# Patient Record
Sex: Female | Born: 1965 | Race: White | Hispanic: No | Marital: Married | Smoking: Never smoker
Health system: Southern US, Community
[De-identification: ages and names within clinical notes are randomized; demographics above are authoritative.]

## PROBLEM LIST (undated history)

## (undated) DIAGNOSIS — J309 Allergic rhinitis, unspecified: Secondary | ICD-10-CM

## (undated) DIAGNOSIS — J45909 Unspecified asthma, uncomplicated: Secondary | ICD-10-CM

## (undated) DIAGNOSIS — E039 Hypothyroidism, unspecified: Secondary | ICD-10-CM

## (undated) DIAGNOSIS — I10 Essential (primary) hypertension: Secondary | ICD-10-CM

## (undated) DIAGNOSIS — K219 Gastro-esophageal reflux disease without esophagitis: Secondary | ICD-10-CM

## (undated) HISTORY — DX: Allergic rhinitis, unspecified: J30.9

## (undated) HISTORY — DX: Essential (primary) hypertension: I10

## (undated) HISTORY — PX: THYROIDECTOMY: SHX17

## (undated) HISTORY — DX: Hypothyroidism, unspecified: E03.9

## (undated) HISTORY — DX: Gastro-esophageal reflux disease without esophagitis: K21.9

## (undated) HISTORY — DX: Unspecified asthma, uncomplicated: J45.909

---

## 2001-04-29 ENCOUNTER — Encounter: Payer: Self-pay | Admitting: Endocrinology

## 2001-04-29 ENCOUNTER — Ambulatory Visit (HOSPITAL_COMMUNITY): Admission: RE | Admit: 2001-04-29 | Discharge: 2001-04-29 | Payer: Self-pay | Admitting: Gastroenterology

## 2001-05-03 ENCOUNTER — Ambulatory Visit (HOSPITAL_COMMUNITY): Admission: RE | Admit: 2001-05-03 | Discharge: 2001-05-03 | Payer: Self-pay | Admitting: *Deleted

## 2001-05-10 ENCOUNTER — Encounter: Payer: Self-pay | Admitting: Endocrinology

## 2001-05-10 ENCOUNTER — Ambulatory Visit (HOSPITAL_COMMUNITY): Admission: RE | Admit: 2001-05-10 | Discharge: 2001-05-10 | Payer: Self-pay | Admitting: Endocrinology

## 2001-07-03 ENCOUNTER — Ambulatory Visit (HOSPITAL_COMMUNITY): Admission: RE | Admit: 2001-07-03 | Discharge: 2001-07-03 | Payer: Self-pay | Admitting: Endocrinology

## 2001-07-03 ENCOUNTER — Encounter: Payer: Self-pay | Admitting: Endocrinology

## 2002-01-16 ENCOUNTER — Emergency Department (HOSPITAL_COMMUNITY): Admission: EM | Admit: 2002-01-16 | Discharge: 2002-01-16 | Payer: Self-pay

## 2002-02-11 ENCOUNTER — Encounter: Payer: Self-pay | Admitting: Critical Care Medicine

## 2002-02-11 ENCOUNTER — Ambulatory Visit (HOSPITAL_COMMUNITY): Admission: RE | Admit: 2002-02-11 | Discharge: 2002-02-11 | Payer: Self-pay | Admitting: Critical Care Medicine

## 2002-02-24 ENCOUNTER — Encounter (HOSPITAL_COMMUNITY): Admission: RE | Admit: 2002-02-24 | Discharge: 2002-05-25 | Payer: Self-pay | Admitting: Endocrinology

## 2002-02-28 ENCOUNTER — Encounter: Payer: Self-pay | Admitting: Endocrinology

## 2002-03-19 ENCOUNTER — Encounter: Admission: RE | Admit: 2002-03-19 | Discharge: 2002-03-19 | Payer: Self-pay | Admitting: Otolaryngology

## 2002-03-19 ENCOUNTER — Encounter: Payer: Self-pay | Admitting: Otolaryngology

## 2002-03-21 ENCOUNTER — Ambulatory Visit (HOSPITAL_COMMUNITY): Admission: RE | Admit: 2002-03-21 | Discharge: 2002-03-21 | Payer: Self-pay | Admitting: Internal Medicine

## 2003-02-05 ENCOUNTER — Other Ambulatory Visit: Admission: RE | Admit: 2003-02-05 | Discharge: 2003-02-05 | Payer: Self-pay | Admitting: Obstetrics and Gynecology

## 2003-07-29 ENCOUNTER — Inpatient Hospital Stay (HOSPITAL_COMMUNITY): Admission: AD | Admit: 2003-07-29 | Discharge: 2003-08-03 | Payer: Self-pay | Admitting: Obstetrics and Gynecology

## 2003-07-30 ENCOUNTER — Encounter (INDEPENDENT_AMBULATORY_CARE_PROVIDER_SITE_OTHER): Payer: Self-pay | Admitting: Specialist

## 2003-09-16 ENCOUNTER — Other Ambulatory Visit: Admission: RE | Admit: 2003-09-16 | Discharge: 2003-09-16 | Payer: Self-pay | Admitting: Obstetrics and Gynecology

## 2004-01-04 ENCOUNTER — Ambulatory Visit: Payer: Self-pay | Admitting: Pulmonary Disease

## 2004-02-04 ENCOUNTER — Ambulatory Visit: Payer: Self-pay | Admitting: Critical Care Medicine

## 2004-03-04 ENCOUNTER — Ambulatory Visit: Payer: Self-pay | Admitting: Critical Care Medicine

## 2004-04-04 ENCOUNTER — Ambulatory Visit: Payer: Self-pay | Admitting: Critical Care Medicine

## 2004-04-22 ENCOUNTER — Ambulatory Visit: Payer: Self-pay | Admitting: Critical Care Medicine

## 2004-05-04 ENCOUNTER — Ambulatory Visit: Payer: Self-pay | Admitting: Critical Care Medicine

## 2004-06-06 ENCOUNTER — Ambulatory Visit: Payer: Self-pay | Admitting: Critical Care Medicine

## 2004-07-04 ENCOUNTER — Ambulatory Visit: Payer: Self-pay | Admitting: Critical Care Medicine

## 2004-08-08 ENCOUNTER — Ambulatory Visit: Payer: Self-pay | Admitting: Critical Care Medicine

## 2004-09-07 ENCOUNTER — Ambulatory Visit: Payer: Self-pay | Admitting: Critical Care Medicine

## 2004-10-10 ENCOUNTER — Ambulatory Visit: Payer: Self-pay | Admitting: Critical Care Medicine

## 2004-10-17 ENCOUNTER — Other Ambulatory Visit: Admission: RE | Admit: 2004-10-17 | Discharge: 2004-10-17 | Payer: Self-pay | Admitting: Obstetrics and Gynecology

## 2004-11-03 ENCOUNTER — Ambulatory Visit: Payer: Self-pay | Admitting: Critical Care Medicine

## 2004-12-01 ENCOUNTER — Ambulatory Visit: Payer: Self-pay | Admitting: Internal Medicine

## 2005-01-05 ENCOUNTER — Ambulatory Visit: Payer: Self-pay | Admitting: Critical Care Medicine

## 2005-02-07 ENCOUNTER — Ambulatory Visit: Payer: Self-pay | Admitting: Internal Medicine

## 2005-03-08 ENCOUNTER — Ambulatory Visit: Payer: Self-pay | Admitting: Pulmonary Disease

## 2005-04-12 ENCOUNTER — Ambulatory Visit: Payer: Self-pay | Admitting: Pulmonary Disease

## 2005-04-26 ENCOUNTER — Ambulatory Visit: Payer: Self-pay | Admitting: Critical Care Medicine

## 2005-05-15 ENCOUNTER — Ambulatory Visit: Payer: Self-pay | Admitting: Pulmonary Disease

## 2005-06-14 ENCOUNTER — Ambulatory Visit: Payer: Self-pay | Admitting: Critical Care Medicine

## 2005-07-12 ENCOUNTER — Ambulatory Visit: Payer: Self-pay | Admitting: Pulmonary Disease

## 2005-07-19 ENCOUNTER — Ambulatory Visit: Payer: Self-pay | Admitting: Internal Medicine

## 2005-08-14 ENCOUNTER — Ambulatory Visit: Payer: Self-pay | Admitting: Emergency Medicine

## 2005-09-27 ENCOUNTER — Ambulatory Visit: Payer: Self-pay | Admitting: Critical Care Medicine

## 2005-10-25 ENCOUNTER — Ambulatory Visit: Payer: Self-pay | Admitting: Critical Care Medicine

## 2005-11-22 ENCOUNTER — Ambulatory Visit: Payer: Self-pay | Admitting: Critical Care Medicine

## 2005-12-21 ENCOUNTER — Ambulatory Visit: Payer: Self-pay | Admitting: Critical Care Medicine

## 2006-03-14 ENCOUNTER — Ambulatory Visit: Payer: Self-pay | Admitting: Critical Care Medicine

## 2006-03-15 ENCOUNTER — Ambulatory Visit: Payer: Self-pay | Admitting: Critical Care Medicine

## 2006-03-30 ENCOUNTER — Ambulatory Visit: Payer: Self-pay | Admitting: Internal Medicine

## 2006-04-17 ENCOUNTER — Ambulatory Visit: Payer: Self-pay | Admitting: Critical Care Medicine

## 2006-04-26 ENCOUNTER — Ambulatory Visit: Payer: Self-pay | Admitting: Critical Care Medicine

## 2006-06-18 ENCOUNTER — Ambulatory Visit: Payer: Self-pay | Admitting: Critical Care Medicine

## 2006-07-24 ENCOUNTER — Ambulatory Visit: Payer: Self-pay | Admitting: Critical Care Medicine

## 2006-08-28 ENCOUNTER — Ambulatory Visit: Payer: Self-pay | Admitting: Critical Care Medicine

## 2006-10-04 ENCOUNTER — Ambulatory Visit: Payer: Self-pay | Admitting: Critical Care Medicine

## 2006-10-25 DIAGNOSIS — E039 Hypothyroidism, unspecified: Secondary | ICD-10-CM | POA: Insufficient documentation

## 2006-10-25 DIAGNOSIS — K219 Gastro-esophageal reflux disease without esophagitis: Secondary | ICD-10-CM | POA: Insufficient documentation

## 2006-10-25 DIAGNOSIS — J309 Allergic rhinitis, unspecified: Secondary | ICD-10-CM | POA: Insufficient documentation

## 2006-11-05 ENCOUNTER — Ambulatory Visit: Payer: Self-pay | Admitting: Critical Care Medicine

## 2006-12-05 ENCOUNTER — Ambulatory Visit: Payer: Self-pay | Admitting: Critical Care Medicine

## 2007-01-14 ENCOUNTER — Ambulatory Visit: Payer: Self-pay | Admitting: Critical Care Medicine

## 2007-01-21 ENCOUNTER — Ambulatory Visit: Payer: Self-pay | Admitting: Critical Care Medicine

## 2007-02-19 ENCOUNTER — Ambulatory Visit: Payer: Self-pay | Admitting: Critical Care Medicine

## 2007-04-05 ENCOUNTER — Ambulatory Visit: Payer: Self-pay | Admitting: Critical Care Medicine

## 2007-05-07 ENCOUNTER — Ambulatory Visit: Payer: Self-pay | Admitting: Critical Care Medicine

## 2007-05-09 DIAGNOSIS — J4550 Severe persistent asthma, uncomplicated: Secondary | ICD-10-CM | POA: Insufficient documentation

## 2007-06-07 ENCOUNTER — Ambulatory Visit: Payer: Self-pay | Admitting: Critical Care Medicine

## 2007-07-10 ENCOUNTER — Ambulatory Visit: Payer: Self-pay | Admitting: Critical Care Medicine

## 2007-09-03 ENCOUNTER — Ambulatory Visit: Payer: Self-pay | Admitting: Critical Care Medicine

## 2007-10-07 ENCOUNTER — Ambulatory Visit: Payer: Self-pay | Admitting: Critical Care Medicine

## 2007-10-17 ENCOUNTER — Ambulatory Visit: Payer: Self-pay | Admitting: Critical Care Medicine

## 2007-10-30 ENCOUNTER — Ambulatory Visit: Payer: Self-pay | Admitting: Critical Care Medicine

## 2007-10-31 ENCOUNTER — Encounter: Payer: Self-pay | Admitting: Critical Care Medicine

## 2007-11-21 ENCOUNTER — Encounter: Payer: Self-pay | Admitting: Internal Medicine

## 2007-11-22 ENCOUNTER — Ambulatory Visit: Payer: Self-pay | Admitting: Critical Care Medicine

## 2008-01-30 ENCOUNTER — Ambulatory Visit: Payer: Self-pay | Admitting: Critical Care Medicine

## 2008-03-13 ENCOUNTER — Ambulatory Visit: Payer: Self-pay | Admitting: Critical Care Medicine

## 2008-04-09 ENCOUNTER — Ambulatory Visit: Payer: Self-pay | Admitting: Critical Care Medicine

## 2008-06-02 ENCOUNTER — Ambulatory Visit: Payer: Self-pay | Admitting: Critical Care Medicine

## 2008-06-04 ENCOUNTER — Ambulatory Visit: Payer: Self-pay | Admitting: Pulmonary Disease

## 2008-06-04 ENCOUNTER — Telehealth (INDEPENDENT_AMBULATORY_CARE_PROVIDER_SITE_OTHER): Payer: Self-pay | Admitting: *Deleted

## 2008-07-02 ENCOUNTER — Telehealth (INDEPENDENT_AMBULATORY_CARE_PROVIDER_SITE_OTHER): Payer: Self-pay | Admitting: *Deleted

## 2008-07-23 ENCOUNTER — Ambulatory Visit: Payer: Self-pay | Admitting: Critical Care Medicine

## 2008-08-27 ENCOUNTER — Ambulatory Visit: Payer: Self-pay | Admitting: Critical Care Medicine

## 2008-10-05 ENCOUNTER — Telehealth: Payer: Self-pay | Admitting: Critical Care Medicine

## 2008-10-08 ENCOUNTER — Ambulatory Visit: Payer: Self-pay | Admitting: Critical Care Medicine

## 2008-11-26 ENCOUNTER — Ambulatory Visit: Payer: Self-pay | Admitting: Critical Care Medicine

## 2009-01-28 ENCOUNTER — Ambulatory Visit: Payer: Self-pay | Admitting: Critical Care Medicine

## 2009-02-26 ENCOUNTER — Ambulatory Visit: Payer: Self-pay | Admitting: Critical Care Medicine

## 2009-03-30 ENCOUNTER — Ambulatory Visit: Payer: Self-pay | Admitting: Critical Care Medicine

## 2009-04-15 ENCOUNTER — Ambulatory Visit: Payer: Self-pay | Admitting: Critical Care Medicine

## 2009-05-11 ENCOUNTER — Ambulatory Visit: Payer: Self-pay | Admitting: Critical Care Medicine

## 2009-05-18 ENCOUNTER — Ambulatory Visit: Payer: Self-pay | Admitting: Family Medicine

## 2009-05-18 DIAGNOSIS — S93609A Unspecified sprain of unspecified foot, initial encounter: Secondary | ICD-10-CM | POA: Insufficient documentation

## 2009-06-18 ENCOUNTER — Ambulatory Visit: Payer: Self-pay | Admitting: Critical Care Medicine

## 2009-07-27 ENCOUNTER — Ambulatory Visit: Payer: Self-pay | Admitting: Critical Care Medicine

## 2009-08-05 ENCOUNTER — Ambulatory Visit: Payer: Self-pay | Admitting: Critical Care Medicine

## 2009-10-07 ENCOUNTER — Ambulatory Visit: Payer: Self-pay | Admitting: Critical Care Medicine

## 2009-11-18 ENCOUNTER — Ambulatory Visit: Payer: Self-pay | Admitting: Critical Care Medicine

## 2010-01-07 ENCOUNTER — Telehealth: Payer: Self-pay | Admitting: Critical Care Medicine

## 2010-01-10 ENCOUNTER — Ambulatory Visit: Payer: Self-pay | Admitting: Critical Care Medicine

## 2010-01-27 ENCOUNTER — Ambulatory Visit: Payer: Self-pay | Admitting: Critical Care Medicine

## 2010-02-09 ENCOUNTER — Ambulatory Visit: Payer: Self-pay | Admitting: Critical Care Medicine

## 2010-03-11 ENCOUNTER — Ambulatory Visit: Admit: 2010-03-11 | Payer: Self-pay | Admitting: Critical Care Medicine

## 2010-03-22 NOTE — Assessment & Plan Note (Signed)
Summary: xolair/apc  Nurse Visit   Allergies: No Known Drug Allergies  Medication Administration  Injection # 1:    Medication: Xolair (omalizumab) 150mg     Diagnosis: EXTRINSIC ASTHMA, UNSPECIFIED (ICD-493.00)    Route: SQ    Site: L deltoid    Exp Date: 04/2012    Lot #: [id number]    Mfr: Mendel Ryder    Comments: Injection given by Dimas Millin in allergy lab. Xolair 300mg . 1.43ml x 1 in Left and Right Deltoid. Pt did not wait.   Orders Added: 1)  Admin of patients own med IM/SQ [96372M]

## 2010-03-22 NOTE — Assessment & Plan Note (Signed)
Summary: xolair/ mbw  Nurse Visit   Allergies: No Known Drug Allergies  Medication Administration  Injection # 1:    Medication: Xolair (omalizumab) 150mg     Diagnosis: EXTRINSIC ASTHMA, UNSPECIFIED (ICD-493.00)    Route: SQ    Site: R deltoid    Exp Date: 06/20/2012    Lot #: [id number]    Mfr: GENENTECH    Comments: 1.2 ML IN RIGHT ARM AND 1.2 ML IN LEFT PT WAITED 20 MINS    Patient tolerated injection without complications    Given by: SUSANNE FORD IN ALLERGY LAB  Orders Added: 1)  Xolair (omalizumab) 150mg  [J2357] 2)  Administration xolair injection [id number]   Medication Administration  Injection # 1:    Medication: Xolair (omalizumab) 150mg     Diagnosis: EXTRINSIC ASTHMA, UNSPECIFIED (ICD-493.00)    Route: SQ    Site: R deltoid    Exp Date: 06/20/2012    Lot #: [id number]    Mfr: GENENTECH    Comments: 1.2 ML IN RIGHT ARM AND 1.2 ML IN LEFT PT WAITED 20 MINS    Patient tolerated injection without complications    Given by: SUSANNE FORD IN ALLERGY LAB  Orders Added: 1)  Xolair (omalizumab) 150mg  [J2357] 2)  Administration xolair injection [[id number]]

## 2010-03-22 NOTE — Assessment & Plan Note (Signed)
Summary: xolair/jd  Nurse Visit   Allergies: No Known Drug Allergies  Medication Administration  Injection # 1:    Medication: Xolair (omalizumab) 150mg     Diagnosis: EXTRINSIC ASTHMA, UNSPECIFIED (ICD-493.00)    Route: SQ    Site: L deltoid    Exp Date: 11/2012    Lot #: [id number]    Mfr: Genetech    Comments: 1.2 ML IN LEFT AND RIGHT ARM 300MG  PT DIDNT WAIT CHARGED [id number] AND [id number]    Given by: Dimas Millin IN ALLERGY LAB  Orders Added: 1)  Xolair (omalizumab) 150mg  [J2357] 2)  Administration xolair injection [[id number]]   Medication Administration  Injection # 1:    Medication: Xolair (omalizumab) 150mg     Diagnosis: EXTRINSIC ASTHMA, UNSPECIFIED (ICD-493.00)    Route: SQ    Site: L deltoid    Exp Date: 11/2012    Lot #: [id number]    Mfr: Genetech    Comments: 1.2 ML IN LEFT AND RIGHT ARM 300MG  PT DIDNT WAIT CHARGED [id number] AND [id number]    Given by: Dimas Millin IN ALLERGY LAB  Orders Added: 1)  Xolair (omalizumab) 150mg  [J2357] 2)  Administration xolair injection [[id number]]

## 2010-03-22 NOTE — Miscellaneous (Signed)
Summary: Injection Record / [facility]    Injection Record / [facility]    Imported By: Lennie Odor 07/12/2009 15:34:17  _____________________________________________________________________  External Attachment:    Type:   Image     Comment:   External Document

## 2010-03-22 NOTE — Assessment & Plan Note (Signed)
Summary: Krystal Scott  Nurse Visit   Allergies: No Known Drug Allergies  Medication Administration  Injection # 1:    Medication: Xolair (omalizumab) 150mg     Diagnosis: EXTRINSIC ASTHMA, UNSPECIFIED (ICD-493.00)    Route: SQ    Site: R deltoid    Exp Date: 05/2012    Lot #: [id number]    Mfr: Mendel Ryder    Comments: Injection given by Drucie Opitz, CMA in allergy lab. Xolair 300mg . 1.23ml x 1 in Right and Left Deltoid. Pt waited 10 minutes.     Patient tolerated injection without complications  Orders Added: 1)  Admin of patients own med IM/SQ [[id number]]

## 2010-03-22 NOTE — Assessment & Plan Note (Signed)
Summary: Krystal Scott  Nurse Visit   Allergies: No Known Drug Allergies  Medication Administration  Injection # 1:    Medication: Xolair (omalizumab) 150mg     Diagnosis: EXTRINSIC ASTHMA, UNSPECIFIED (ICD-493.00)    Route: SQ    Site: R deltoid    Exp Date: 02/2012    Lot #: [id number]    Mfr: Mendel Ryder    Comments: Injection given by Dimas Millin in allergy lab. Xolair 300mg . 1.48ml x 1 in Right and Left Deltoid. Pt did not wait.   Orders Added: 1)  Admin of Therapeutic Inj  intramuscular or subcutaneous [[id number]] 2)  Xolair (omalizumab) 150mg  [J2357]

## 2010-03-22 NOTE — Assessment & Plan Note (Signed)
Summary: Pulmonary OV   Primary Provider/Referring Provider:  Dr. Sabino Niemann [place] in [place]  CC:  6 wk follow up.  Pt states breathing is better since last OV.  C/o getting yellow and bloody mucus out of sinuses x 2 day.  Denies cough, SOB, and chest tightness.  Requesting flu vac today.Marland Kitchen  History of Present Illness:   Krystal Scott is a 45 year old African American female, with moderate persistent asthma   November 18, 2009 11:03 AM Two days ago developed yellow and bloody mucus.  Notes some wheezing. likes dulera better Pt denies any significant sore throat, nasal congestion or excess secretions, fever, chills, sweats, unintended weight loss, pleurtic or exertional chest pain, orthopnea PND, or leg swelling Pt denies any increase in rescue therapy over baseline, denies waking up needing it or having any early am or nocturnal exacerbations of coughing/wheezing/or dyspnea. Pt also denies any obvious fluctuation in symptoms wiht weather or environmental change or other alleviating or aggravating factors     Asthma History    Asthma Control Assessment:    Age range: 12+ years    Symptoms: 0-2 days/week    Nighttime Awakenings: 0-2/month    Interferes w/ normal activity: some limitations    SABA use (not for EIB): 0-2 days/week    ATAQ questionnaire: 0    FEV1: 1.44 liters (today)    FEV1 Pred: 2.69 liters (today)    Exacerbations requiring oral systemic steroids: 0-1/year    Asthma Control Assessment: Very Poorly Controlled   Preventive Screening-Counseling & Management  Alcohol-Tobacco     Smoking Status: never  Current Medications (verified): 1)  Synthroid 175 Mcg  Tabs (Levothyroxine Sodium) .Marland Kitchen.. 1 By Mouth Daily Except Monday 2)  Nexium 40 Mg  Cpdr (Esomeprazole Magnesium) .... One By Mouth Once Daily As Needed 3)  Xolair 150 Mg  Solr (Omalizumab) .... 300mg  Subcutaneously Monthly 4)  Proair Hfa 108 (90 Base) Mcg/act Aers (Albuterol Sulfate) .... One To Two Puffs Every 4  Hours As Needed 5)  Albuterol Sulfate (2.5 Mg/35ml) 0.083% Nebu (Albuterol Sulfate) .... As Needed 6)  Qvar 80 Mcg/act Aers (Beclomethasone Dipropionate) .... 4   Puffs Two Times A Day 7)  Dulera 200-5 Mcg/act Aero (Mometasone Furo-Formoterol Fum) .... 2 Puffs Twice Per Day 8)  Flunisolide 0.025 % Soln (Flunisolide) .... Two Sprays Each Nostril Daily  Allergies (verified): No Known Drug Allergies  Past History:  Past medical, surgical, family and social histories (including risk factors) reviewed, and no changes noted (except as noted below).  Past Medical History: Reviewed history from 10/17/2007 and no changes required. Allergic rhinitis Asthma   -FeV1 54%  Fef 25-75 27%  08/2006 GERD Hypothyroidism  Past Surgical History: Reviewed history from 05/18/2009 and no changes required. Thyroidectomy- 1988  Family History: Reviewed history from 05/18/2009 and no changes required. father- DM mother- htn  Social History: Reviewed history from 05/18/2009 and no changes required. Never Smoked Alcohol use-no Drug use-no Regular exercise-yes  Review of Systems       The patient complains of shortness of breath with activity, productive cough, nasal congestion/difficulty breathing through nose, and change in color of mucus.  The patient denies shortness of breath at rest, non-productive cough, coughing up blood, chest pain, irregular heartbeats, acid heartburn, indigestion, loss of appetite, weight change, abdominal pain, difficulty swallowing, sore throat, tooth/dental problems, headaches, sneezing, itching, ear ache, anxiety, depression, hand/feet swelling, joint stiffness or pain, rash, and fever.    Vital Signs:  Patient profile:   45 year old  female Height:      64 inches Weight:      169 pounds BMI:     29.11 O2 Sat:      99 % on Room air Temp:     98.5 degrees F oral Pulse rate:   77 / minute BP sitting:   110 / 78  (left arm) Cuff size:   regular  Vitals Entered By:  Gweneth Dimitri RN (November 18, 2009 10:56 AM)  O2 Flow:  Room air CC: 6 wk follow up.  Pt states breathing is better since last OV.  C/o getting yellow and bloody mucus out of sinuses x 2 day.  Denies cough, SOB, chest tightness.  Requesting flu vac today. Comments Medications reviewed with patient Daytime contact number verified with patient. Gweneth Dimitri RN  November 18, 2009 10:58 AM    Physical Exam  Additional Exam:  Gen. Pleasant, well-nourished, in no distress ENT - thick white secretions   persisting turbinate edema,  post nasal drip + Neck: No JVD, no thyromegaly, no carotid bruits Lungs: no use of accessory muscles, no dullness to percussion, clear  Cardiovascular: Rhythm regular, heart sounds  normal, no murmurs or gallops, no peripheral edema Musculoskeletal: No deformities, no cyanosis or clubbing      Pre-Spirometry FEV1    Value: 1.44 L     Pred: 2.69 L     Impression & Recommendations:  Problem # 1:  OTHER ACUTE SINUSITIS (ICD-461.8) Assessment Deteriorated Acute /recurrent sinusitis with flare plan augmentin x 10days  nasal hygiene double flonase flu and pneumonia vaccines Her updated medication list for this problem includes:    Flunisolide 0.025 % Soln (Flunisolide) .Marland Kitchen..Marland Kitchen Two sprays each nostril daily    Augmentin 875-125 Mg Tabs (Amoxicillin-pot clavulanate) ..... By mouth twice daily please ignore prior rx and honor this rx  Orders: Est. Patient Level IV (72536)  Problem # 2:  EXTRINSIC ASTHMA, UNSPECIFIED (ICD-493.00) Assessment: Unchanged see assessment number one continue inhaled medications  Medications Added to Medication List This Visit: 1)  Augmentin 875-125 Mg Tabs (Amoxicillin-pot clavulanate) .... By mouth twice daily 2)  Augmentin 875-125 Mg Tabs (Amoxicillin-pot clavulanate) .... By mouth twice daily please ignore prior rx and honor this rx  Complete Medication List: 1)  Synthroid 175 Mcg Tabs (Levothyroxine sodium) .Marland Kitchen.. 1 by mouth  daily except monday 2)  Nexium 40 Mg Cpdr (Esomeprazole magnesium) .... One by mouth once daily as needed 3)  Xolair 150 Mg Solr (Omalizumab) .... 300mg  subcutaneously monthly 4)  Proair Hfa 108 (90 Base) Mcg/act Aers (Albuterol sulfate) .... One to two puffs every 4 hours as needed 5)  Albuterol Sulfate (2.5 Mg/54ml) 0.083% Nebu (Albuterol sulfate) .... As needed 6)  Qvar 80 Mcg/act Aers (Beclomethasone dipropionate) .... 4   puffs two times a day 7)  Dulera 200-5 Mcg/act Aero (Mometasone furo-formoterol fum) .... 2 puffs twice per day 8)  Flunisolide 0.025 % Soln (Flunisolide) .... Two sprays each nostril daily 9)  Augmentin 875-125 Mg Tabs (Amoxicillin-pot clavulanate) .... By mouth twice daily please ignore prior rx and honor this rx  Other Orders: Flu Vaccine 56yrs + [phone]) Admin 1st Vaccine (47425) Pneumococcal Vaccine (95638) Admin of Any Addtl Vaccine (75643)  Patient Instructions: 1)  Augmentin one twice daily for 10days 2)  Increase flonase to two sprays each nostril twice daily for 7 days then reduce to once daily 3)  No change in Dulera 4)  Use nasal wash such as [facility] Med and rinse twice daily for [employer]  5)  Flu vaccine and pneumovax today 6)  Return 2 months  Prescriptions: AUGMENTIN 875-125 MG  TABS (AMOXICILLIN-POT CLAVULANATE) By mouth twice daily Please ignore prior Rx and honor this Rx  #20 x 0   Entered and Authorized by:   Storm Frisk MD   Signed by:   Storm Frisk MD on 11/18/2009   Method used:   [facility]        [facility] [phone]* (retail)       [address] [place], [place]  [address]       Ph: [phone] or [phone]       Fax: [phone]   RxID:   [id number] AUGMENTIN 875-125 MG  TABS (AMOXICILLIN-POT CLAVULANATE) By mouth twice daily  #14 x 0   Entered and Authorized by:   Storm Frisk MD   Signed by:   Storm Frisk MD on 11/18/2009   Method used:   [facility] to        [facility] [phone]*  (retail)       [address] [place], [place]  [address]       Ph: [phone] or [phone]       Fax: [phone]   RxID:   [id number]    Immunizations Administered:  Influenza Vaccine # 1:    Vaccine Type: Fluvax 3+    Site: left deltoid    Mfr: GlaxoSmithKline    Dose: 0.5 ml    Route: IM    Given by: Kandice Hams CMA    Exp. Date: 08/20/2010    Lot #: [id number]    VIS given: 09/14/09 version given November 18, 2009.  Pneumonia Vaccine:    Vaccine Type: Pneumovax    Site: right deltoid    Mfr: Merck    Dose: 0.5 ml    Route: IM    Given by: Kandice Hams CMA    Exp. Date: 03/09/2011    Lot #: [id number]    VIS given: 01/25/09 version given November 18, 2009.  Flu Vaccine Consent Questions:    Do you have a history of severe allergic reactions to this vaccine? no    Any prior history of allergic reactions to egg and/or gelatin? no    Do you have a sensitivity to the preservative Thimersol? no    Do you have a past history of Guillan-Barre Syndrome? no    Do you currently have an acute febrile illness? no    Have you ever had a severe reaction to latex? no    Vaccine information given and explained to patient? yes    Are you currently pregnant? no   Augmentin 7 day rx sent in error.  [facility] with [place].  Informed her to pls disregard the 7 day augmentin rx and honor the 10 day rx.  She verbalized understanding of this.  Gweneth Dimitri RN  November 18, 2009 11:16 AM   Prevention & Chronic Care Immunizations   Influenza vaccine: Fluvax 3+  (11/18/2009)    Tetanus booster: Not documented    Pneumococcal vaccine: Pneumovax  (11/18/2009)  Other Screening   Pap smear: Not documented    Mammogram: Not documented   Smoking status: never  (11/18/2009)  Lipids   Total Cholesterol: Not documented   LDL: Not documented   LDL Direct: Not documented   HDL: Not documented   Triglycerides: Not documented  Nursing Instructions: Give Pneumovax  today Give Flu vaccine today     Appended Document: Pulmonary OV fax shirapa sinthusec  [place]

## 2010-03-22 NOTE — Assessment & Plan Note (Signed)
Summary: Pulmonary OV   Primary Provider/Referring Provider:  Dr. Sabino Niemann [place] in [place]  CC:  4 month follow up.  States she was off of meds for a short period of time and while off of them she would occasionally have SOB and wheezing while working out.  States since she has restarted meds and taking them on a regular basis no longer having SOB or wheezing while working out.  states she sees and feels an improvement in breathing.  denies chest tightness and cough.  Requesting rxs for [facility]erol neb and [facility]ort[facility] [place]. Marland Kitchen  History of Present Illness:   Ms. Manka is a 45 year old African American female, with moderate persistent asthma    April 15, 2009 10:30 AM lots of no shows working out more since 8/10.  Taekwondo one month ago with insurance and had to put hold on meds. and was real show off meds due to lack of insurance now back on meds,  and is better now recently 260 best ever pfr:  300   Asthma History    Asthma Control Assessment:    Age range: 12+ years    Symptoms: 0-2 days/week    Nighttime Awakenings: 0-2/month    Interferes w/ normal activity: no limitations    SABA use (not for EIB): 0-2 days/week    ATAQ questionnaire: 0    FEV1: 1.44 liters (today)    FEV1 Pred: 2.69 liters (today)    Exacerbations requiring oral systemic steroids: 0-1/year    Asthma Control Assessment: Very Poorly Controlled    Preventive Screening-Counseling & Management  Alcohol-Tobacco     Smoking Status: never  Current Medications (verified): 1)  Synthroid 175 Mcg  Tabs (Levothyroxine Sodium) .Marland Kitchen.. 1 By Mouth Daily Except Monday 2)  Nexium 40 Mg  Cpdr (Esomeprazole Magnesium) .... One By Mouth Once Daily 3)  Xolair 150 Mg  Solr (Omalizumab) .... 300mg  Subcutaneously Monthly 4)  Proair Hfa 108 (90 Base) Mcg/act Aers (Albuterol Sulfate) .... One To Two Puffs Every 4 Hours As Needed 5)  Albuterol Sulfate (2.5 Mg/80ml) 0.083% Nebu (Albuterol Sulfate) .... As Needed 6)   Qvar 80 Mcg/act Aers (Beclomethasone Dipropionate) .... 2 Puffs Two Times A Day 7)  Symbicort 160-4.5 Mcg/act  Aero (Budesonide-Formoterol Fumarate) .... Two Puffs Twice Daily 8)  Nasacort Aq 55 Mcg/act  Aers (Triamcinolone Acetonide(Nasal)) .... Two Puffs Each Nostril Daily  Allergies (verified): No Known Drug Allergies  Past History:  Past medical, surgical, family and social histories (including risk factors) reviewed, and no changes noted (except as noted below).  Past Medical History: Reviewed history from 10/17/2007 and no changes required. Allergic rhinitis Asthma   -FeV1 54%  Fef 25-75 27%  08/2006 GERD Hypothyroidism  Family History: Reviewed history and no changes required.  Social History: Reviewed history and no changes required.  Review of Systems       The patient complains of shortness of breath with activity and non-productive cough.  The patient denies shortness of breath at rest, productive cough, coughing up blood, chest pain, irregular heartbeats, acid heartburn, indigestion, loss of appetite, weight change, abdominal pain, difficulty swallowing, sore throat, tooth/dental problems, headaches, nasal congestion/difficulty breathing through nose, sneezing, itching, ear ache, anxiety, depression, hand/feet swelling, joint stiffness or pain, rash, change in color of mucus, and fever.    Vital Signs:  Patient profile:   45 year old female Height:      64 inches Weight:      173 pounds BMI:     29.80 O2  Sat:      98 % on Room air Temp:     97.9 degrees F oral Pulse rate:   93 / minute BP sitting:   120 / 84  (left arm) Cuff size:   regular  Vitals Entered By: Gweneth Dimitri RN (April 15, 2009 10:08 AM)  O2 Flow:  Room air CC: 4 month follow up.  States she was off of meds for a short period of time and while off of them she would occasionally have SOB and wheezing while working out.  States since she has restarted meds and taking them on a regular basis no  longer having SOB or wheezing while working out.  states she sees and feels an improvement in breathing.  denies chest tightness and cough.  Requesting rxs [facility]ol neb [facility] [place].  Comments Medications reviewed with patient Daytime contact number verified with patient. Gweneth Dimitri RN  April 15, 2009 10:08 AM    Physical Exam  Additional Exam:  Gen. Pleasant, well-nourished, in no distress ENT - no lesions,  post nasal drip + Neck: No JVD, no thyromegaly, no carotid bruits Lungs: no use of accessory muscles, no dullness to percussion, insp/exp wheezes, poor airflow Cardiovascular: Rhythm regular, heart sounds  normal, no murmurs or gallops, no peripheral edema Musculoskeletal: No deformities, no cyanosis or clubbing      Pre-Spirometry FEV1    Value: 1.44 L     Pred: 2.69 L     Impression & Recommendations:  Problem # 1:  EXTRINSIC ASTHMA, UNSPECIFIED (ICD-493.00) Assessment Deteriorated  Moderate persistent asthma with exacerbation due to medication noncompliance plan Prednisone 10mg  Take 4 daily for two days, then 3 daily for two days, then two daily for two days then one daily for two days then stop Increase qvar three puffs twice daily for 7days then two puff twice daily No other medication changes Peak flow rate check about once daily, see asthma action plan Return 2 months [place]  Medications Added to Medication List This Visit: 1)  Qvar 80 Mcg/act Aers (Beclomethasone dipropionate) .... 3  puffs two times a day for one week then two puff twice daily 2)  Prednisone 10 Mg Tabs (Prednisone) .... Take as directed take 4 daily for two days, then 3 daily for two days, then two daily for two days then one daily for two days then stop  Complete Medication List: 1)  Synthroid 175 Mcg Tabs (Levothyroxine sodium) .Marland Kitchen.. 1 by mouth daily except monday 2)  Nexium 40 Mg Cpdr (Esomeprazole magnesium) .... One by mouth once daily 3)  Xolair 150 Mg Solr  (Omalizumab) .... 300mg  subcutaneously monthly 4)  Proair Hfa 108 (90 Base) Mcg/act Aers (Albuterol sulfate) .... One to two puffs every 4 hours as needed 5)  Albuterol Sulfate (2.5 Mg/67ml) 0.083% Nebu (Albuterol sulfate) .... As needed 6)  Qvar 80 Mcg/act Aers (Beclomethasone dipropionate) .... 3  puffs two times a day for one week then two puff twice daily 7)  Symbicort 160-4.5 Mcg/act Aero (Budesonide-formoterol fumarate) .... Two puffs twice daily 8)  Nasacort Aq 55 Mcg/act Aers (Triamcinolone acetonide(nasal)) .... Two puffs each nostril daily 9)  Prednisone 10 Mg Tabs (Prednisone) .... Take as directed take 4 daily for two days, then 3 daily for two days, then two daily for two days then one daily for two days then stop  Other Orders: Est. Patient Level V (04540)  Asthma Management Plan    Asthma Severity: Severe Persistent  Control Assessment: Very Poorly Controlled    Personal best PEF: 300 liters/minute    Predicted PEF: 473 liters/minute    Working PEF: 473 liters/minute    Plan based on PEF formula: [place] [employer] Zone: (Range: 350 to 300) QVAR 80 MCG/ACT AERS two puff twice daily SYMBICORT 160-4.5 MCG/ACT  AERO:  2 puffs every 12 hours  Yellow Zone: ALBUTEROL SULFATE (2.5 MG/3ML) 0.083% NEBU:  nebulizer treatment twice a day PROAIR HFA 108 (90 BASE) MCG/ACT AERS,ALBUTEROL SULFATE (2.5 MG/3ML) 0.083% NEBU:  2 puffs every 3-4 hours (yellow zone) QVAR 80 MCG/ACT AERS:  2 puffs every 12 hours SYMBICORT 160-4.5 MCG/ACT  AERO:  2 puffs every 12 hours  Red Zone: Call your physician for shortness of breath.  Start Prednisone Taper.   ALBUTEROL SULFATE (2.5 MG/3ML) 0.083% NEBU:  nebulizer treatment twice a day  Patient Instructions: 1)  Prednisone 10mg  Take 4 daily for two days, then 3 daily for two days, then two daily for two days then one daily for two days then stop 2)  Increase qvar three puffs twice daily for 7days then two puff twice daily 3)  No other  medication changes 4)  Peak flow rate check about once daily, see asthma action plan 5)  Return 2 months [place] Prescriptions: PREDNISONE 10 MG  TABS (PREDNISONE) Take as directed Take 4 daily for two days, then 3 daily for two days, then two daily for two days then one daily for two days then stop  #20 x 0   Entered and Authorized by:   Storm Frisk MD   Signed by:   Storm Frisk MD on 04/15/2009   Method used:   [facility]        [facility] [phone]* (retail)       [address] [place], [place]  [address]       Ph: [phone] or [phone]       Fax: [phone]   RxID:   [id number] ALBUTEROL SULFATE (2.5 MG/3ML) 0.083% NEBU (ALBUTEROL SULFATE) as needed  #80 x 6   Entered and Authorized by:   Storm Frisk MD   Signed by:   Storm Frisk MD on 04/15/2009   Method used:   [facility]ically to        [facility] [phone]* (retail)       [address] [place], [place]  [address]       Ph: [phone] or [phone]       Fax: [phone]   RxID:   [id number]     Appended Document: Pulmonary OV fax   Dr. Sabino Niemann [place] in [place]

## 2010-03-22 NOTE — Assessment & Plan Note (Signed)
Summary: Pulmonary OV   Primary Provider/Referring Provider:  Dr. Sabino Niemann [place] in [place]  CC:  2 month follow up.  Pt states overall breathing is doing well but does c/o wheezing yesterday and slight increased SOB with activity yesterday and today.  Denies chest tightness and cough..  History of Present Illness:   Krystal Scott is a 45 year old African American female, with moderate persistent asthma   January 27, 2010 11:15 AM No issus so far this fall.  Dyspnea last two days sl more wheezing. Dust issues.  today with activity was more dyspneic.  No problems since last ov 9/11. Pt denies any significant sore throat, nasal congestion or excess secretions, fever, chills, sweats, unintended weight loss, pleurtic or exertional chest pain, orthopnea PND, or leg swelling Pt denies any increase in rescue therapy over baseline, denies waking up needing it or having any early am or nocturnal exacerbations of coughing/wheezing/or dyspnea.   Asthma History    Asthma Control Assessment:    Age range: 12+ years    Symptoms: 0-2 days/week    Nighttime Awakenings: 0-2/month    Interferes w/ normal activity: some limitations    SABA use (not for EIB): 0-2 days/week    ATAQ questionnaire: 0    Exacerbations requiring oral systemic steroids: 0-1/year    Asthma Control Assessment: Not Well Controlled   Current Medications (verified): 1)  Synthroid 175 Mcg  Tabs (Levothyroxine Sodium) .Marland Kitchen.. 1 By Mouth Daily Except Monday 2)  Nexium 40 Mg  Cpdr (Esomeprazole Magnesium) .... One By Mouth Once Daily 3)  Xolair 150 Mg  Solr (Omalizumab) .... 300mg  Subcutaneously Monthly 4)  Proair Hfa 108 (90 Base) Mcg/act Aers (Albuterol Sulfate) .... One To Two Puffs Every 4 Hours As Needed 5)  Albuterol Sulfate (2.5 Mg/34ml) 0.083% Nebu (Albuterol Sulfate) .... As Needed 6)  Qvar 80 Mcg/act Aers (Beclomethasone Dipropionate) .... 4   Puffs Two Times A Day 7)  Dulera 200-5 Mcg/act Aero (Mometasone Furo-Formoterol Fum)  .... 2 Puffs Twice Per Day 8)  Cyclobenzaprine Hcl 10 Mg Tabs (Cyclobenzaprine Hcl) .... As Needed  Allergies (verified): No Known Drug Allergies  Past History:  Past medical, surgical, family and social histories (including risk factors) reviewed, and no changes noted (except as noted below).  Past Medical History: Reviewed history from 10/17/2007 and no changes required. Allergic rhinitis Asthma   -FeV1 54%  Fef 25-75 27%  08/2006 GERD Hypothyroidism  Past Surgical History: Reviewed history from 05/18/2009 and no changes required. Thyroidectomy- 1988  Family History: Reviewed history from 05/18/2009 and no changes required. father- DM mother- htn  Social History: Reviewed history from 05/18/2009 and no changes required. Never Smoked Alcohol use-no Drug use-no Regular exercise-yes  Review of Systems       The patient complains of shortness of breath with activity.  The patient denies shortness of breath at rest, productive cough, non-productive cough, coughing up blood, chest pain, irregular heartbeats, acid heartburn, indigestion, loss of appetite, weight change, abdominal pain, difficulty swallowing, sore throat, tooth/dental problems, headaches, nasal congestion/difficulty breathing through nose, sneezing, itching, ear ache, anxiety, depression, hand/feet swelling, joint stiffness or pain, rash, change in color of mucus, and fever.    Vital Signs:  Patient profile:   45 year old female Height:      64 inches Weight:      174.31 pounds BMI:     30.03 O2 Sat:      99 % on Room air Temp:     97.8 degrees F oral  Pulse rate:   98 / minute BP sitting:   130 / 70  (right arm) Cuff size:   large  Vitals Entered By: Krystal Dimitri RN (January 27, 2010 11:07 AM)  O2 Flow:  Room air CC: 2 month follow up.  Pt states overall breathing is doing well but does c/o wheezing yesterday and slight increased SOB with activity yesterday and today.  Denies chest tightness and  cough. Comments Medications reviewed with patient Daytime contact number verified with patient. Krystal Dimitri RN  January 27, 2010 11:08 AM    Physical Exam  Additional Exam:  Gen. Pleasant, well-nourished, in no distress ENT -improved edema, no purulence Neck: No JVD, no thyromegaly, no carotid bruits Lungs: no use of accessory muscles, no dullness to percussion, clear  Cardiovascular: Rhythm regular, heart sounds  normal, no murmurs or gallops, no peripheral edema Musculoskeletal: No deformities, no cyanosis or clubbing      Impression & Recommendations:  Problem # 1:  EXTRINSIC ASTHMA, UNSPECIFIED (ICD-493.00) Assessment Improved Moderate persistent asthma with atopy now stable plan No change in inhaled medications.  except reduce qvar to 3puff two times a day  Maintain treatment program as currently prescribed.  Medications Added to Medication List This Visit: 1)  Nexium 40 Mg Cpdr (Esomeprazole magnesium) .... One by mouth once daily 2)  Qvar 80 Mcg/act Aers (Beclomethasone dipropionate) .... 3   puffs two times a day 3)  Cyclobenzaprine Hcl 10 Mg Tabs (Cyclobenzaprine hcl) .... As needed 4)  Fluticasone Propionate 50 Mcg/act Susp (Fluticasone propionate) .... One - two sprays each nostril daily as needed  Complete Medication List: 1)  Synthroid 175 Mcg Tabs (Levothyroxine sodium) .Marland Kitchen.. 1 by mouth daily except monday 2)  Nexium 40 Mg Cpdr (Esomeprazole magnesium) .... One by mouth once daily 3)  Xolair 150 Mg Solr (Omalizumab) .... 300mg  subcutaneously monthly 4)  Proair Hfa 108 (90 Base) Mcg/act Aers (Albuterol sulfate) .... One to two puffs every 4 hours as needed 5)  Albuterol Sulfate (2.5 Mg/30ml) 0.083% Nebu (Albuterol sulfate) .... As needed 6)  Qvar 80 Mcg/act Aers (Beclomethasone dipropionate) .... 3   puffs two times a day 7)  Dulera 200-5 Mcg/act Aero (Mometasone furo-formoterol fum) .... 2 puffs twice per day 8)  Cyclobenzaprine Hcl 10 Mg Tabs (Cyclobenzaprine  hcl) .... As needed 9)  Fluticasone Propionate 50 Mcg/act Susp (Fluticasone propionate) .... One - two sprays each nostril daily as needed  Other Orders: Est. Patient Level III (16109)  Patient Instructions: 1)  Reduce Qvar to three puff twice daily 2)  No other medication changes 3)  Return 4 months 4)  Follow reflux diet Prescriptions: QVAR 80 MCG/ACT AERS (BECLOMETHASONE DIPROPIONATE) 3   puffs two times a day  #1 x 6   Entered and Authorized by:   Storm Frisk MD   Signed by:   Storm Frisk MD on 01/27/2010   Method used:   [facility] to        [facility] [phone]* (retail)       [address] [place], [place]  [address]       Ph: [phone] or [phone]       Fax: [phone]   RxID:   [id number]     Prevention & Chronic Care Immunizations   Influenza vaccine: Fluvax 3+  (11/18/2009)    Tetanus booster: Not documented    Pneumococcal vaccine: Pneumovax  (11/18/2009)  Other Screening  Pap smear: Not documented    Mammogram: Not documented   Smoking status: never  (11/18/2009)  Lipids   Total Cholesterol: Not documented   LDL: Not documented   LDL Direct: Not documented   HDL: Not documented   Triglycerides: Not documented  Appended Document: Pulmonary OV fax Shirapa Sinthusec in [place]

## 2010-03-22 NOTE — Assessment & Plan Note (Signed)
Summary: Pulmonary OV   Primary Provider/Referring Provider:  Dr. Sabino Niemann [place] in [place]  CC:  Follow up.  Pt states breathing is "awful."  having increased use in rescue inhaler and using nebs more frequently since seasons started to change.  Pt also c/o wheezing and cough.  States she feels like she needs to cough up some mucus but cannot get anything to come up.  Marland Kitchen  History of Present Illness:   Krystal Scott is a 45 year old African American female, with moderate persistent asthma    April 15, 2009 10:30 AM lots of no shows working out more since 8/10.  Taekwondo one month ago with insurance and had to put hold on meds. and was real show off meds due to lack of insurance now back on meds,  and is better now recently 260 best ever pfr:  300  August 05, 2009 2:51 PM Has lost 20#.  Not eating carbs and sugar.   Eating strict  diet No issues with acid reflux during the diet phase  Now last two months is more dyspneic.  Using neb more.  ? bronchial infection. Not checked PFR.  She is afraid of the results.  I suspect ongoing non compliance is a major issue here.   Asthma History    Asthma Control Assessment:    Age range: 12+ years    Symptoms: throughout the day    Nighttime Awakenings: 0-2/month    Interferes w/ normal activity: no limitations    SABA use (not for EIB): several times per day    FEV1: 1.44 liters (today)    FEV1 Pred: 2.69 liters (today)    Exacerbations requiring oral systemic steroids: 0-1/year    Asthma Control Assessment: Very Poorly Controlled   Preventive Screening-Counseling & Management  Alcohol-Tobacco     Smoking Status: never  Current Medications (verified): 1)  Synthroid 175 Mcg  Tabs (Levothyroxine Sodium) .Marland Kitchen.. 1 By Mouth Daily Except Monday 2)  Nexium 40 Mg  Cpdr (Esomeprazole Magnesium) .... One By Mouth Once Daily 3)  Xolair 150 Mg  Solr (Omalizumab) .... 300mg  Subcutaneously Monthly 4)  Proair Hfa 108 (90 Base) Mcg/act Aers  (Albuterol Sulfate) .... One To Two Puffs Every 4 Hours As Needed 5)  Albuterol Sulfate (2.5 Mg/2ml) 0.083% Nebu (Albuterol Sulfate) .... As Needed 6)  Qvar 80 Mcg/act Aers (Beclomethasone Dipropionate) .... 3  Puffs Two Times A Day 7)  Symbicort 160-4.5 Mcg/act  Aero (Budesonide-Formoterol Fumarate) .... Two Puffs Twice Daily 8)  Flunisolide 0.025 % Soln (Flunisolide) .... As Needed 9)  Diclofenac Sodium 75 Mg Tbec (Diclofenac Sodium) .... Take 1 Tablet By Mouth Two Times A Day  Allergies (verified): No Known Drug Allergies  Past History:  Past medical, surgical, family and social histories (including risk factors) reviewed, and no changes noted (except as noted below).  Past Medical History: Reviewed history from 10/17/2007 and no changes required. Allergic rhinitis Asthma   -FeV1 54%  Fef 25-75 27%  08/2006 GERD Hypothyroidism  Past Surgical History: Reviewed history from 05/18/2009 and no changes required. Thyroidectomy- 1988  Family History: Reviewed history from 05/18/2009 and no changes required. father- DM mother- htn  Social History: Reviewed history from 05/18/2009 and no changes required. Never Smoked Alcohol use-no Drug use-no Regular exercise-yes  Review of Systems       The patient complains of shortness of breath with activity, non-productive cough, acid heartburn, indigestion, and nasal congestion/difficulty breathing through nose.  The patient denies shortness of breath at rest, productive cough,  coughing up blood, chest pain, irregular heartbeats, loss of appetite, weight change, abdominal pain, difficulty swallowing, sore throat, tooth/dental problems, headaches, sneezing, itching, ear ache, anxiety, depression, hand/feet swelling, joint stiffness or pain, rash, change in color of mucus, and fever.    Vital Signs:  Patient profile:   45 year old female Height:      64 inches Weight:      159 pounds BMI:     27.39 O2 Sat:      98 % Temp:     99.0  degrees F oral Pulse rate:   86 / minute BP sitting:   138 / 80  (left arm) Cuff size:   regular  Vitals Entered By: Gweneth Dimitri RN (August 05, 2009 2:42 PM)  Serial Vital Signs/Assessments:  Time      Position  BP       Pulse  Resp  Temp     By                     120/78                         Zackery Barefoot CMA  CC: Follow up.  Pt states breathing is "awful."  having increased use in rescue inhaler and using nebs more frequently since seasons started to change.  Pt also c/o wheezing and cough.  States she feels like she needs to cough up some mucus but cannot get anything to come up.   Comments Medications reviewed with patient Daytime contact number verified with patient. Gweneth Dimitri RN  August 05, 2009 2:42 PM    Physical Exam  Additional Exam:  Gen. Pleasant, well-nourished, in no distress ENT - thick white secretions   post nasal drip + Neck: No JVD, no thyromegaly, no carotid bruits Lungs: no use of accessory muscles, no dullness to percussion, insp/exp wheezes, poor airflow Cardiovascular: Rhythm regular, heart sounds  normal, no murmurs or gallops, no peripheral edema Musculoskeletal: No deformities, no cyanosis or clubbing      Pre-Spirometry FEV1    Value: 1.44 L     Pred: 2.69 L     Impression & Recommendations:  Problem # 1:  OTHER ACUTE SINUSITIS (ICD-461.8) Assessment Deteriorated Acute sinusitis with non compliance plan augmentin x 10days pulse prednisone  Her updated medication list for this problem includes:    Flunisolide 0.025 % Soln (Flunisolide) .Marland Kitchen..Marland Kitchen Two sprays each nostril daily    Augmentin 875-125 Mg Tabs (Amoxicillin-pot clavulanate) ..... By mouth twice daily  Orders: Est. Patient Level IV (11914)  Problem # 2:  EXTRINSIC ASTHMA, UNSPECIFIED (ICD-493.00) Assessment: Deteriorated  Moderate persistent asthma with exacerbation due to medication noncompliance, poor disease insight, ongoing sinusitis plan pulse prednisone cont inhaled  meds as prescribed use nasal steroid daily  Medications Added to Medication List This Visit: 1)  Qvar 80 Mcg/act Aers (Beclomethasone dipropionate) .... 3  puffs two times a day 2)  Symbicort 160-4.5 Mcg/act Aero (Budesonide-formoterol fumarate) .... Two puffs twice daily 3)  Flunisolide 0.025 % Soln (Flunisolide) .... Two sprays each nostril daily 4)  Diclofenac Sodium 75 Mg Tbec (Diclofenac sodium) .... Take 1 tablet by mouth two times a day 5)  Prednisone 10 Mg Tabs (Prednisone) .... Take as directed 4 each am x 4 days, 3 x 4 days, 2 x 4 days, 1 x 4 days then stop 6)  Augmentin 875-125 Mg Tabs (Amoxicillin-pot clavulanate) .... By mouth twice daily  Complete Medication  List: 1)  Synthroid 175 Mcg Tabs (Levothyroxine sodium) .Marland Kitchen.. 1 by mouth daily except monday 2)  Nexium 40 Mg Cpdr (Esomeprazole magnesium) .... One by mouth once daily 3)  Xolair 150 Mg Solr (Omalizumab) .... 300mg  subcutaneously monthly 4)  Proair Hfa 108 (90 Base) Mcg/act Aers (Albuterol sulfate) .... One to two puffs every 4 hours as needed 5)  Albuterol Sulfate (2.5 Mg/59ml) 0.083% Nebu (Albuterol sulfate) .... As needed 6)  Qvar 80 Mcg/act Aers (Beclomethasone dipropionate) .... 3  puffs two times a day 7)  Symbicort 160-4.5 Mcg/act Aero (Budesonide-formoterol fumarate) .... Two puffs twice daily 8)  Flunisolide 0.025 % Soln (Flunisolide) .... Two sprays each nostril daily 9)  Diclofenac Sodium 75 Mg Tbec (Diclofenac sodium) .... Take 1 tablet by mouth two times a day 10)  Prednisone 10 Mg Tabs (Prednisone) .... Take as directed 4 each am x 4 days, 3 x 4 days, 2 x 4 days, 1 x 4 days then stop 11)  Augmentin 875-125 Mg Tabs (Amoxicillin-pot clavulanate) .... By mouth twice daily  Patient Instructions: 1)  Use saline sinus rinse twice daily 2)  Resume nasal steroid flunisolide two sprays each nostril daily 3)  Prednisone 10mg  4 each am x 4 days, 3 x 4 days, 2 x 4 days, 1 x 4 days then stop 4)  Augmentin one twice daily  for 7 days 5)  Stay on Qvar and Symbicort 6)  Return two months [place] Prescriptions: AUGMENTIN 875-125 MG  TABS (AMOXICILLIN-POT CLAVULANATE) By mouth twice daily  #14 x 0   Entered and Authorized by:   Storm Frisk MD   Signed by:   Storm Frisk MD on 08/05/2009   Method used:   [facility] to        [facility] [phone]* (retail)       [address] [place], [place]  [address]       Ph: [phone] or [phone]       Fax: [phone]   RxID:   [id number] PREDNISONE 10 MG  TABS (PREDNISONE) Take as directed 4 each am x 4 days, 3 x 4 days, 2 x 4 days, 1 x 4 days then stop  #40 x 0   Entered and Authorized by:   Storm Frisk MD   Signed by:   Storm Frisk MD on 08/05/2009   Method used:   Electronically to        [facility] [phone]* (retail)       [address] [place], [place]  [address]       Ph: [phone] or [phone]       Fax: [phone]   RxID:   [id number] QVAR 80 MCG/ACT AERS (BECLOMETHASONE DIPROPIONATE) 3  puffs two times a day  #1 x 6   Entered and Authorized by:   Storm Frisk MD   Signed by:   Storm Frisk MD on 08/05/2009   Method used:   Electronically to        [facility] [phone]* (retail)       [address] [place], [place]  [address]       Ph: [phone] or [phone]       Fax: [phone]   RxID:   [id number] SYMBICORT 160-4.5 MCG/ACT  AERO (BUDESONIDE-FORMOTEROL FUMARATE) Two puffs twice daily  #1 x  6   Entered and Authorized by:   Storm Frisk MD   Signed by:   Storm Frisk MD on 08/05/2009   Method used:   Electronically to        [facility] [phone]* (retail)       [address] [place], [place]  [address]       Ph: [phone] or [phone]       Fax: [phone]   RxID:   [id number] FLUNISOLIDE 0.025 % SOLN (FLUNISOLIDE) Two sprays each nostril daily  #1 x 6   Entered and Authorized by:   Storm Frisk MD   Signed by:   Storm Frisk MD on  08/05/2009   Method used:   Electronically to        [facility] [phone]* (retail)       [address] [place], [place]  [address]       Ph: [phone] or [phone]       Fax: [phone]   RxID:   [id number]     Appended Document: Pulmonary OV fax Dr Prentice Docker  in [place]

## 2010-03-22 NOTE — Assessment & Plan Note (Signed)
Summary: LEFT FOOT PAIN POSSIBLE BREAK   Vital Signs:  Patient Profile:   45 Years Old Female CC:      left foot/ankle injury X 19 days pain with activity Height:     64 inches (162.56 cm) Weight:      174 pounds O2 Sat:      99 % O2 treatment:    Room Air Temp:     97.4 degrees F oral Pulse rate:   89 / minute Pulse rhythm:   regular Resp:     14 per minute BP sitting:   122 / 83  (right arm) Cuff size:   regular  Pt. in pain?   no  Vitals Entered By: Lajean Saver RN (May 18, 2009 3:48 PM)                   Updated Prior Medication List: SYNTHROID 175 MCG  TABS (LEVOTHYROXINE SODIUM) 1 by mouth daily except Monday NEXIUM 40 MG  CPDR (ESOMEPRAZOLE MAGNESIUM) one by mouth once daily XOLAIR 150 MG  SOLR (OMALIZUMAB) 300mg  subcutaneously monthly PROAIR HFA 108 (90 BASE) MCG/ACT AERS (ALBUTEROL SULFATE) one to two puffs every 4 hours as needed ALBUTEROL SULFATE (2.5 MG/3ML) 0.083% NEBU (ALBUTEROL SULFATE) as needed QVAR 80 MCG/ACT AERS (BECLOMETHASONE DIPROPIONATE) 3  puffs two times a day for one week then two puff twice daily SYMBICORT 160-4.5 MCG/ACT  AERO (BUDESONIDE-FORMOTEROL FUMARATE) Two puffs twice daily NASACORT AQ 55 MCG/ACT  AERS (TRIAMCINOLONE ACETONIDE(NASAL)) Two puffs each nostril daily  Current Allergies (reviewed today): No known allergies History of Present Illness Chief Complaint: left foot/ankle injury X 19 days pain with activity History of Present Illness: Subjective:  While in a tae kwon do class 2 weeks ago patient injured her left medial foot while kicking (she struck her instructors elbow).  Initial swelling resolved but she has persistent medial foot pain.  REVIEW OF SYSTEMS Constitutional Symptoms      Denies fever, chills, night sweats, weight loss, weight gain, and fatigue.  Eyes       Denies change in vision, eye pain, eye discharge, glasses, contact lenses, and eye surgery. Ear/Nose/Throat/Mouth       Denies hearing loss/aids, change  in hearing, ear pain, ear discharge, dizziness, frequent runny nose, frequent nose bleeds, sinus problems, sore throat, hoarseness, and tooth pain or bleeding.  Respiratory       Denies dry cough, productive cough, wheezing, shortness of breath, asthma, bronchitis, and emphysema/COPD.  Cardiovascular       Denies murmurs, chest pain, and tires easily with exhertion.    Gastrointestinal       Denies stomach pain, nausea/vomiting, diarrhea, constipation, blood in bowel movements, and indigestion. Genitourniary       Denies painful urination, kidney stones, and loss of urinary control. Neurological       Denies paralysis, seizures, and fainting/blackouts. Musculoskeletal       Complains of joint pain, joint stiffness, and decreased range of motion.      Denies muscle pain, redness, swelling, muscle weakness, and gout.  Skin       Denies bruising, unusual mles/lumps or sores, and hair/skin or nail changes.  Psych       Denies mood changes, temper/anger issues, anxiety/stress, speech problems, depression, and sleep problems. Other Comments: Patient was doing taekwondo 19 days ago and while kicking injured left ankle.    Past History:  Past Medical History: Reviewed history from 10/17/2007 and no changes required. Allergic rhinitis Asthma   -FeV1 54%  Fef 25-75 27%  08/2006 GERD Hypothyroidism  Past Surgical History: Thyroidectomy- 14  Family History: father- DM mother- htn  Social History: Never Smoked Alcohol use-no Drug use-no Regular exercise-yes Drug Use:  no Does Patient Exercise:  yes   Objective:  No acute distress  Left foot:  No swelling, but there is  prominence of the carpal bone medially, tender to palpation.  All joints have full range of motion; distal neurovascular intact  X-ray left foot:  Negative fracture Assessment New Problems: FOOT SPRAIN, LEFT (ICD-845.10)   Plan New Medications/Changes: NAPROXEN 375 MG TABS (NAPROXEN) 1 by mouth q12hr pc   #20 x 0, 05/18/2009, Donna Christen MD  New Orders: T-Foot Left Min 3 Views [73630TC] New Patient Level III [57846] Ankle-Foot Orthosis Ankle Gauntlet Prefab [L1902] Planning Comments:   Ankle/foot brace applied:  wear for 2 to 3 weeks until healed.  Begin NSAID.  Apply ice pack once or twice daily. Wear supportive athletic shoes, and recommend "Superfeet" shoe insert for arch support. Begin foot exercises ([employer] information and instruction patient handout given)  Avoid running, jumping, dancing until significant healing occurs. Follow-up orthopedist if not improving.   The patient and/or caregiver has been counseled thoroughly with regard to medications prescribed including dosage, schedule, interactions, rationale for use, and possible side effects and they verbalize understanding.  Diagnoses and expected course of recovery discussed and will return if not improved as expected or if the condition worsens. Patient and/or caregiver verbalized understanding.  Prescriptions: NAPROXEN 375 MG TABS (NAPROXEN) 1 by mouth q12hr pc  #20 x 0   Entered and Authorized by:   Donna Christen MD   Signed by:   Donna Christen MD on 05/18/2009   Method used:   [facility]ron[facility] to        [facility] [phone]* (retail)       [address] [place], [place]  [address]       Ph: [phone] or [phone]       Fax: [phone]   RxID:   [id number]

## 2010-03-22 NOTE — Assessment & Plan Note (Signed)
Summary: Krystal Scott  Nurse Visit   Allergies: No Known Drug Allergies  Medication Administration  Injection # 1:    Medication: Xolair (omalizumab) 150mg     Diagnosis: EXTRINSIC ASTHMA, UNSPECIFIED (ICD-493.00)    Route: IM    Site: R deltoid    Exp Date: 06/20/2012    Lot #: [id number]    Mfr: [employer]    Comments: xolair 300, 60 units, 1.2 ml x 1 in Right deltoid and 1.2 ml x 1 in Left deltoid.     Given by: Drucie Opitz, CMA  Orders Added: 1)  Administration xolair injection [phone] 2)  Xolair (omalizumab) 150mg  [J2357]   Medication Administration  Injection # 1:    Medication: Xolair (omalizumab) 150mg     Diagnosis: EXTRINSIC ASTHMA, UNSPECIFIED (ICD-493.00)    Route: IM    Site: R deltoid    Exp Date: 06/20/2012    Lot #: [id number]    Mfr: [employer]    Comments: xolair 300, 60 units, 1.2 ml x 1 in Right deltoid and 1.2 ml x 1 in Left deltoid.     Given by: Drucie Opitz, CMA  Orders Added: 1)  Administration xolair injection [phone] 2)  Xolair (omalizumab) 150mg  [J2357]

## 2010-03-22 NOTE — Assessment & Plan Note (Signed)
Summary: Pulmonary OV   Primary Provider/Referring Provider:  Dr. Sabino Niemann Scott in [place]  CC:  2 wk follow up.  Pt states she is using resue HFA more frequently x 1 wk d/t incresease SOB at rest, wheezing, and some chest tightness.  Denies cough..  History of Present Illness:   Krystal Scott is a 45 year old African American female, with moderate persistent asthma    April 15, 2009 10:30 AM lots of no shows working out more since 8/10.  Taekwondo one month ago with insurance and had to put hold on meds. and was real show off meds due to lack of insurance now back on meds,  and is better now recently 260 best ever pfr:  300  August 05, 2009 2:51 PM Has lost 20#.  Not eating carbs and sugar.   Eating strict  diet No issues with acid reflux during the diet phase  Now last two months is more dyspneic.  Using neb more.  ? bronchial infection. Not checked PFR.  She is afraid of the results.  I suspect ongoing non compliance is a major issue here.  October 07, 2009 11:59 AM Pt notes two weeks of more proair use and neb use.   Notes no cough.  Notes upper airway wheeze. More dyspnea.  Missed last months xolair injection.  Pt denies any sinus complaints. Pt denies any significant sore throat, nasal congestion or excess secretions, fever, chills, sweats, unintended weight loss, pleurtic or exertional chest pain, orthopnea PND, or leg swelling     Asthma History    Asthma Control Assessment:    Age range: 12+ years    Symptoms: throughout the day    Nighttime Awakenings: 0-2/month    Interferes w/ normal activity: some limitations    SABA use (not for EIB): several times per day    ATAQ questionnaire: 1-2    FEV1: 1.44 liters (today)    FEV1 Pred: 2.69 liters (today)    Exacerbations requiring oral systemic steroids: 0-1/year    Asthma Control Assessment: Very Poorly Controlled   Preventive Screening-Counseling & Management  Alcohol-Tobacco     Smoking Status:  never  Current Medications (verified): 1)  Synthroid 175 Mcg  Tabs (Levothyroxine Sodium) .Marland Kitchen.. 1 By Mouth Daily Except Monday 2)  Nexium 40 Mg  Cpdr (Esomeprazole Magnesium) .... One By Mouth Once Daily As Needed 3)  Xolair 150 Mg  Solr (Omalizumab) .... 300mg  Subcutaneously Monthly 4)  Proair Hfa 108 (90 Base) Mcg/act Aers (Albuterol Sulfate) .... One To Two Puffs Every 4 Hours As Needed 5)  Albuterol Sulfate (2.5 Mg/1ml) 0.083% Nebu (Albuterol Sulfate) .... As Needed 6)  Qvar 80 Mcg/act Aers (Beclomethasone Dipropionate) .... 3  Puffs Two Times A Day 7)  Symbicort 160-4.5 Mcg/act  Aero (Budesonide-Formoterol Fumarate) .... Two Puffs Twice Daily 8)  Flunisolide 0.025 % Soln (Flunisolide) .... Two Sprays Each Nostril Daily 9)  Diclofenac Sodium 75 Mg Tbec (Diclofenac Sodium) .... Take 1 Tablet By Mouth Two Times A  As Needed  Allergies (verified): No Known Drug Allergies  Past History:  Past medical, surgical, family and social histories (including risk factors) reviewed, and no changes noted (except as noted below).  Past Medical History: Reviewed history from 10/17/2007 and no changes required. Allergic rhinitis Asthma   -FeV1 54%  Fef 25-75 27%  08/2006 GERD Hypothyroidism  Past Surgical History: Reviewed history from 05/18/2009 and no changes required. Thyroidectomy- 1988  Family History: Reviewed history from 05/18/2009 and no changes required. father- DM mother- htn  Social History: Reviewed history from 05/18/2009 and no changes required. Never Smoked Alcohol use-no Drug use-no Regular exercise-yes  Review of Systems       The patient complains of shortness of breath with activity, shortness of breath at rest, productive cough, non-productive cough, nasal congestion/difficulty breathing through nose, and change in color of mucus.  The patient denies coughing up blood, chest pain, irregular heartbeats, acid heartburn, indigestion, loss of appetite, weight change,  abdominal pain, difficulty swallowing, sore throat, tooth/dental problems, headaches, sneezing, itching, ear ache, anxiety, depression, hand/feet swelling, joint stiffness or pain, rash, and fever.    Vital Signs:  Patient profile:   45 year old female Height:      64 inches Weight:      165.31 pounds BMI:     28.48 O2 Sat:      98 % on Room air Temp:     98.5 degrees F oral Pulse rate:   82 / minute BP sitting:   130 / 80  (left arm) Cuff size:   regular  Vitals Entered By: Gweneth Dimitri RN (October 07, 2009 11:54 AM)  O2 Flow:  Room air CC: 2 wk follow up.  Pt states she is using resue HFA more frequently x 1 wk d/t incresease SOB at rest, wheezing, some chest tightness.  Denies cough. Comments Medications reviewed with patient Daytime contact number verified with patient. Gweneth Dimitri RN  October 07, 2009 11:54 AM    Physical Exam  Additional Exam:  Gen. Pleasant, well-nourished, in no distress ENT - thick white secretions   post nasal drip + Neck: No JVD, no thyromegaly, no carotid bruits Lungs: no use of accessory muscles, no dullness to percussion, insp/exp wheezes, poor airflow Cardiovascular: Rhythm regular, heart sounds  normal, no murmurs or gallops, no peripheral edema Musculoskeletal: No deformities, no cyanosis or clubbing      Pre-Spirometry FEV1    Value: 1.44 L     Pred: 2.69 L     Impression & Recommendations:  Problem # 1:  OTHER ACUTE SINUSITIS (ICD-461.8) Assessment Deteriorated recurrent acute sinusitis wiht asthma flare, pt continues to be non compliant with overuse of SABA and not calling soon enough to notify for flares.  this is the type of patient that would benefit from a health coach plan augmentin x 10days  repulse prednisone increase qvar 4puff two times a day d/c symbicort and start dulera 200 two puff two times a day for enhanced steroid molecule/dose get xolair today and monthly call sooner with changes  Her updated medication list for  this problem includes:    Flunisolide 0.025 % Soln (Flunisolide) .Marland Kitchen..Marland Kitchen Two sprays each nostril daily    Augmentin 875-125 Mg Tabs (Amoxicillin-pot clavulanate) ..... By mouth twice daily  Orders: Est. Patient Level V (75643)  Medications Added to Medication List This Visit: 1)  Nexium 40 Mg Cpdr (Esomeprazole magnesium) .... One by mouth once daily as needed 2)  Qvar 80 Mcg/act Aers (Beclomethasone dipropionate) .... 4   puffs two times a day 3)  Dulera 200-5 Mcg/act Aero (Mometasone furo-formoterol fum) .... 2 puffs twice per day 4)  Diclofenac Sodium 75 Mg Tbec (Diclofenac sodium) .... Take 1 tablet by mouth two times a  as needed 5)  Prednisone 10 Mg Tabs (Prednisone) .... Take as directed 4 each am x3days, 3 x 3days, 2 x 3days, 1 x 3days then stop 6)  Augmentin 875-125 Mg Tabs (Amoxicillin-pot clavulanate) .... By mouth twice daily  Complete Medication List: 1)  Synthroid 175 Mcg Tabs (Levothyroxine sodium) .Marland Kitchen.. 1 by mouth daily except monday 2)  Nexium 40 Mg Cpdr (Esomeprazole magnesium) .... One by mouth once daily as needed 3)  Xolair 150 Mg Solr (Omalizumab) .... 300mg  subcutaneously monthly 4)  Proair Hfa 108 (90 Base) Mcg/act Aers (Albuterol sulfate) .... One to two puffs every 4 hours as needed 5)  Albuterol Sulfate (2.5 Mg/60ml) 0.083% Nebu (Albuterol sulfate) .... As needed 6)  Qvar 80 Mcg/act Aers (Beclomethasone dipropionate) .... 4   puffs two times a day 7)  Dulera 200-5 Mcg/act Aero (Mometasone furo-formoterol fum) .... 2 puffs twice per day 8)  Flunisolide 0.025 % Soln (Flunisolide) .... Two sprays each nostril daily 9)  Diclofenac Sodium 75 Mg Tbec (Diclofenac sodium) .... Take 1 tablet by mouth two times a  as needed 10)  Prednisone 10 Mg Tabs (Prednisone) .... Take as directed 4 each am x3days, 3 x 3days, 2 x 3days, 1 x 3days then stop 11)  Augmentin 875-125 Mg Tabs (Amoxicillin-pot clavulanate) .... By mouth twice daily  Patient Instructions: 1)  Take augmentin one  twice daily for 7days 2)  Prednisone 10mg  4 each am x3days, 3 x 3days, 2 x 3days, 1 x 3days then stop 3)  Increase Qvar to 4 puff twice daily 4)  Stop symbicort 5)  Start Dulera 200 two puff twice daily 6)  Return 6 weeks 7)  Get your xolair today Prescriptions: DULERA 200-5 MCG/ACT AERO (MOMETASONE FURO-FORMOTEROL FUM) 2 puffs twice per day  #1 x 6   Entered and Authorized by:   Storm Frisk MD   Signed by:   Storm Frisk MD on 10/07/2009   Method used:   Electronically to        [facility] [phone]* (retail)       [address] [place], [place]  [address]       Ph: [phone] or [phone]       Fax: [phone]   RxID:   [id number] AUGMENTIN 875-125 MG  TABS (AMOXICILLIN-POT CLAVULANATE) By mouth twice daily  #20 x 0   Entered and Authorized by:   Storm Frisk MD   Signed by:   Storm Frisk MD on 10/07/2009   Method used:   [facility] to        [facility] [phone]* (retail)       [address] [place], [place]  [address]       Ph: [phone] or [phone]       Fax: [phone]   RxID:   [id number] PREDNISONE 10 MG  TABS (PREDNISONE) Take as directed 4 each am x3days, 3 x 3days, 2 x 3days, 1 x 3days then stop  #30 x 0   Entered and Authorized by:   Storm Frisk MD   Signed by:   Storm Frisk MD on 10/07/2009   Method used:   [facility] to        [facility] [phone]* (retail)       [address] [place], [place]  [address]       Ph: [phone] or [phone]       Fax: [phone]   RxID:   [id number]   Appended Document: Pulmonary OV fax Krystal Scott in [place]

## 2010-03-22 NOTE — Assessment & Plan Note (Signed)
Summary: xolair///kp  Nurse Visit   Allergies: No Known Drug Allergies  Medication Administration  Injection # 1:    Medication: Xolair (omalizumab) 150mg     Diagnosis: EXTRINSIC ASTHMA, UNSPECIFIED (ICD-493.00)    Route: SQ    Site: R deltoid    Exp Date: 06/20/2012    Lot #: [id number]    Mfr: GENENTECH    Comments: 1.2 ML IN RIGHT AND IN LEFT PT DIDNT WAIT ALSO PT WAS CHARGED 96401 X 2     Given by: Babette Relic SCOTT IN [facility]   Medication Administration  Injection # 1:    Medication: Xolair (omalizumab) 150mg     Diagnosis: EXTRINSIC ASTHMA, UNSPECIFIED (ICD-493.00)    Route: SQ    Site: R deltoid    Exp Date: 06/20/2012    Lot #: [id number]    Mfr: GENENTECH    Comments: 1.2 ML IN RIGHT AND IN LEFT PT DIDNT WAIT ALSO PT WAS CHARGED 96401 X 2     Given by: TAMMY SCOTT IN [facility]

## 2010-03-22 NOTE — Progress Notes (Signed)
Summary: nos appt  Phone Note Call from Patient   Caller: [email or web address]  Call For: wright Summary of Call: Rsc nos from 11/17 to 12/8. Initial call taken by: Darletta Moll,  January 07, 2010 11:00 AM

## 2010-03-24 NOTE — Assessment & Plan Note (Signed)
Summary: xolair/ mbw  Nurse Visit   Allergies: No Known Drug Allergies  Medication Administration  Injection # 1:    Medication: Xolair (omalizumab) 150mg     Diagnosis: EXTRINSIC ASTHMA, UNSPECIFIED (ICD-493.00)    Route: SQ    Site: R deltoid    Exp Date: 11/2012    Lot #: [id number]    Mfr: Genetech    Comments: 1.2 ML IN RIGHT AND LEFT  ARM 300MG  CHARGED [id number] AND  17616    Given by: Dimas Millin IN [facility]  Orders Added: 1)  Xolair (omalizumab) 150mg  [J2357] 2)  Administration xolair injection [07371]   Medication Administration  Injection # 1:    Medication: Xolair (omalizumab) 150mg     Diagnosis: EXTRINSIC ASTHMA, UNSPECIFIED (ICD-493.00)    Route: SQ    Site: R deltoid    Exp Date: 11/2012    Lot #: [id number]    Mfr: Genetech    Comments: 1.2 ML IN RIGHT AND LEFT  ARM 300MG  CHARGED [id number] AND  85462    Given by: Dimas Millin IN [facility]  Orders Added: 1)  Xolair (omalizumab) 150mg  [J2357] 2)  Administration xolair injection [70350]

## 2010-05-12 ENCOUNTER — Telehealth: Payer: Self-pay | Admitting: Critical Care Medicine

## 2010-05-12 MED ORDER — FLUTICASONE PROPIONATE 50 MCG/ACT NA SUSP: NASAL | Status: DC

## 2010-05-12 MED ORDER — BECLOMETHASONE DIPROPIONATE 80 MCG/ACT IN AERS: 3.0000 | INHALATION_SPRAY | Freq: Two times a day (BID) | RESPIRATORY_TRACT | Status: DC

## 2010-05-12 MED ORDER — ESOMEPRAZOLE MAGNESIUM 40 MG PO CPDR: 40.0000 mg | DELAYED_RELEASE_CAPSULE | Freq: Every day | ORAL | Status: DC

## 2010-05-12 MED ORDER — MOMETASONE FURO-FORMOTEROL FUM 200-5 MCG/ACT IN AERO: 2.0000 | INHALATION_SPRAY | Freq: Two times a day (BID) | RESPIRATORY_TRACT | Status: DC

## 2010-05-12 NOTE — Telephone Encounter (Signed)
Refills sent. Carron Curie, MA

## 2010-05-16 ENCOUNTER — Other Ambulatory Visit: Payer: Self-pay | Admitting: *Deleted

## 2010-05-16 MED ORDER — BECLOMETHASONE DIPROPIONATE 80 MCG/ACT IN AERS: 3.0000 | INHALATION_SPRAY | Freq: Two times a day (BID) | RESPIRATORY_TRACT | Status: DC

## 2010-05-16 MED ORDER — ESOMEPRAZOLE MAGNESIUM 40 MG PO CPDR: 40.0000 mg | DELAYED_RELEASE_CAPSULE | Freq: Every day | ORAL | Status: DC

## 2010-05-16 MED ORDER — FLUTICASONE PROPIONATE 50 MCG/ACT NA SUSP: NASAL | Status: DC

## 2010-05-16 MED ORDER — ALBUTEROL SULFATE HFA 108 (90 BASE) MCG/ACT IN AERS: 2.0000 | INHALATION_SPRAY | RESPIRATORY_TRACT | Status: DC | PRN

## 2010-05-16 MED ORDER — MOMETASONE FURO-FORMOTEROL FUM 200-5 MCG/ACT IN AERO: 2.0000 | INHALATION_SPRAY | Freq: Two times a day (BID) | RESPIRATORY_TRACT | Status: DC

## 2010-05-16 NOTE — Telephone Encounter (Signed)
RX for medications printed, signed by Dr. Delford Field and faxed to [facility] at [phone].

## 2010-07-05 NOTE — Assessment & Plan Note (Signed)
[facility]                             PULMONARY OFFICE NOTE   BEYOUNCE, DICKENS                   MRN:          [id number]  DATE:01/21/2007                            DOB:          1965/06/04    Krystal Scott is a 45 year old African American female, hospitalization  of moderate persistent asthma, reflux disease, who comes in today with  significant hoarseness but overall shortness of breath is stable at this  time.  She has run out of her Nasarel and Astelin, does maintain Nexium  40 mg daily, Qvar two sprays b.i.d. 80 mcg strength, Xolair 300 mg  monthly.   EXAM:  Temperature 98, blood pressure 118/72, pulse 92, saturation 98%  on room air.  CHEST:  Clear without evidence of wheeze or rhonchi.  CARDIAC:  A regular rate and rhythm without S3, normal S1, S2.  ABDOMEN:  Soft, nontender.  EXTREMITIES:  No edema or clubbing or venous disease.  SKIN:  Clear.  NEUROLOGIC:  Intact.  HEENT:  No jugular venous distention, no lymphadenopathy.  Oropharynx  clear.  NECK:  Supple.   IMPRESSION:  Moderate persistent asthma.   PLAN:  Maintain inhaled medicines as currently dosed.  We refilled her  Nasarel.     Charlcie Cradle Delford Field, MD, [facility]  Electronically Signed    PEW/MedQ  DD: 01/21/2007  DT: 01/22/2007  Job #: [id number]   cc:   Titus Dubin. Alwyn Ren, MD,FACP,FCCP

## 2010-07-05 NOTE — Assessment & Plan Note (Signed)
[facility]                             PULMONARY OFFICE NOTE   MIKAYLEE, ARSENEAU                   MRN:          [id number]  DATE:08/28/2006                            DOB:          December 17, 1965    PULMONARY OFFICE NOTE   Ms. Tout returns today in followup.  This is a 45 year old African-  American female with a history of severe persistent asthma on Xolair  therapy, significant atopic features.  She has been non-adherent with  her medications in the past.   MEDICATIONS:  1. She is on Nexium 40 mg daily.  2. Astelin 2 sprays each nostril b.i.d.  3. Qvar 2 sprays b.i.d. 8 mcg strength.  4. Xolair 300 mg monthly.  5. Nasarel 2 sprays each nostril daily.  6. Synthroid 175 mcg daily.   EXAM:  Temperature 97.9, blood pressure 118/80, pulse 80, saturation 98%  on room air.  CHEST:  Showed distant breath sounds with expiratory wheezes with forced  exhalation.  CARDIAC:  Showed a regular rate and rhythm without S3.  Normal S1, S2.  ABDOMEN:  Soft and nontender.  EXTREMITIES:  Showed no edema or clubbing.  SKIN:  Clear.   IMPRESSION:  Severe persistent asthma, stable at this time.   PLAN:  Maintain inhaled medicines as currently dosed, and we will see  the patient back in return followup in 4 months.  Note is made of recent  pulmonary function studies today showing severe obstructive defect.  An  FEV1 of 54% of predicted.  This is largely unchanged from previous  values.     Charlcie Cradle Delford Field, MD, [facility]  Electronically Signed    PEW/MedQ  DD: 08/28/2006  DT: 08/29/2006  Job #: [id number]   cc:   Titus Dubin. Alwyn Ren, MD,FACP,FCCP

## 2010-07-05 NOTE — Assessment & Plan Note (Signed)
[facility]                                 ON-CALL NOTE   TAMBI, THOLE                   MRN:          [id number]  DATE:07/27/2009                            DOB:          02-25-1965    TIME AND DATE:  1930 hours, July 27, 2009.   PRIMARY CARE Quandarius Nill/PULMONOLOGIST:  Charlcie Cradle. Delford Field, MD, [facility]   Ms. Kaliopi Amadon called to report that at the site of her Xolair  injection this morning, she had 5 cm of itchy redness.  This was  associated with no other symptoms, in fact she just finished her  taekwondo class without any side effects, is now approximately 6 hours  from the time of injection.  She specifically denies any difficulty  breathing, throat itchiness, or difficulty swallowing.  I instructed her  to apply ice and use Benadryl if the reaction continued to worsen at the  site of pain, any systemic symptoms such as fever, chills, sweats or  difficulty breathing, to call back or report to the emergency room  immediately.     Rogelia Rohrer, MD  Electronically Signed    Clemetine Marker  DD: 07/27/2009  DT: 07/28/2009  Job #: [id number]

## 2010-07-08 NOTE — Assessment & Plan Note (Signed)
[facility]                             PULMONARY OFFICE NOTE   NAME:Krystal Scott, Krystal Scott                   MRN:          [id number]  DATE:03/30/2006                            DOB:          [birth year]    PULMONARY ACUTE OFFICE EVALUATION   HISTORY:  This is a 45 year old black female who carries a diagnosis of  extrinsic asthma with GERD and chronic sinusitis who is maintained on  Xolair and has been struggling for approximately a month with persistent  symptoms of nasal congestion with a sensation of water in her ears,  yellow nasal discharge when she blows her nose that has not responded to  Augmentin for 10 days, and sore throat especially when she wakes up in  the morning.  In fact, every morning she wakes up full of phlegm and  congestion with subjective wheezing for which she uses albuterol with  adequate control and interestingly does not wake up prematurely.   She tells me Zithromax is about the only thing that works for her sinus  infections.  The record does indicate that indeed she did have evidence  of sinusitis in 2003 with no sinus CT scans obtained in the meantime.   For a full inventory of medications, please see face sheet column dated  March 30, 2006, which were reviewed with the patient in detail.   PHYSICAL EXAMINATION:  GENERAL:  This is an animated, ambulatory black  female who initially had quite a bit of difficulty answering questions  in a straightforward manner, preferring to use medical terms that she  did not understand.  She is afebrile with normal vital signs and talking  in full sentences.  HEENT:  Reveals moderate turbinate edema with no evidence of purulent  secretions but worse on the left than the right.  Ear canals reveal  dullness on the left tympanic membrane with no evidence of erythema or  definite air fluid level.  Oropharynx was clear with no evidence of  excessive postnasal drainage or cobblestoning but  yet she cleared her  throat frequently during her interview and exam.  NECK:  Supple without cervical adenopathy or tenderness.  Trachea is  midline, no thyromegaly.  LUNG FIELDS:  Completely clear bilaterally to auscultation and  percussion.  HEART:  There is a regular rhythm without murmur, gallop, rub.  ABDOMEN:  Soft, benign.  EXTREMITIES:  Warm without calf tenderness, cyanosis, clubbing, or  edema.   IMPRESSION:  This patient mostly has upper airway instability  masquerading as poorly-controlled asthma.  I suspect she has a  combination of rhinitis, sinusitis, and perhaps secondary GERD (note she  had a normal EGD March 21, 2002, so she could not have too much in  terms of primary GERD).   The main issue is one of sinus ostial dysfunction complicated by  multipharmacy and perhaps residual sinus infection.  To sort through the  differential, I recommended the following:  1. Since she takes Nexium 40 mg daily on a maintenance basis, once she      develops any source of upper airways dysfunction, coughing,  wheezing, etc., she should increase Nexium to b.i.d. dosing and I      reviewed with her also a diet she needs to follow with the      avoidance of menthol-containing lozenge.  2. I prefer for her to use Advil Cold and Sinus for congestion and      stuffiness rather than antihistamines since antihistamines may      decrease ostial function, and then proceed with a sinus CT scan if      the following regimen does not work to her satisfaction.  3. I have reviewed with her optimal use of Nasarel and Afrin as      topicals in the form of a chronic rhinitis flyer emphasizing that      if this does not work to eliminate all her symptoms that we need to      proceed with sinus CT scan as the next logical step.   Finally, I have reviewed the appropriate use of p.r.n.'s including  Mucinex DM for cough and congestion, albuterol for wheeze, and made sure  she understood that the  Qvar should be maintained consistently at two  puffs b.i.d. until reevaluated by Dr. Delford Field.  Followup will be in 2  weeks for medication reconciliation and then by Dr. Delford Field thereafter.     Charlaine Dalton. Sherene Sires, MD, [employer]  Electronically Signed    MBW/MedQ  DD: 03/30/2006  DT: 03/30/2006  Job #: [id number]

## 2010-07-08 NOTE — H&P (Signed)
NAME:  Krystal Scott, Krystal Scott                      ACCOUNT NO.:  [id number]   MEDICAL RECORD NO.:  [id number]                   PATIENT TYPE:  MAT   LOCATION:  [facility]                                 FACILITY:  [facility]   PHYSICIAN:  Duke Salvia. Marcelle Overlie, M.D.            DATE OF BIRTH:  March 23, 1965   DATE OF ADMISSION:  DATE OF DISCHARGE:                                HISTORY & PHYSICAL   DATE OF SCHEDULED ADMISSION:  July 29, 2003   CHIEF COMPLAINT:  For labor induction at term.   HISTORY OF PRESENT ILLNESS:  A 45 year old G3 P0-0-2-0; EDD is August 05, 2003; EGA is 39 weeks; presents for labor induction at term.  This pregnancy  has been complicated by early pregnancy problems with increased trisomy  risk.  She underwent early screening at [facility] and also subsequently had  amniocentesis with normal findings.  Dr. Danise Mina is her asthma doctor and  she had several early pregnancy episodes of asthma flare-ups, has done well  on her current medicines.   PAST MEDICAL HISTORY:  Allergies:  None.  Obstetrical history:  TAB x2.  Medical history is significant for asthma.  Her current medications are  inhalers so she is not currently on any steroids.   Blood type is O positive, rubella titer is immune.  Her group B strep  culture was positive.  Hemoglobin electrophoresis 96% hemoglobin A.   REVIEW OF SYSTEMS:  Also significant for a history of thyroid cancer treated  with radiation therapy in the past.   PHYSICAL EXAMINATION:  VITAL SIGNS:  Blood pressure 130/72, temperature  98.2.  HEENT:  Unremarkable.  NECK:  Supple without masses.  LUNGS:  Clear.  CARDIOVASCULAR:  Regular rate and rhythm without murmurs, rubs, or gallops  noted.  BREASTS:  Without masses.  ABDOMEN:  Soft, flat, and nontender.  Fundal height 38.  Fetal heart rate  140.  PELVIC:  Cervix was closed, vertex presenting.  EXTREMITIES AND NEUROLOGIC:  Unremarkable.   IMPRESSION:  Thirty-nine-week intrauterine pregnancy.   PLAN:   For two-stage labor induction.  This is being done electively at term  because her husband is in the [employer] and will be deployed overseas soon.  The induction protocol was discussed with her preadmission.                                               Richard M. Marcelle Overlie, M.D.    RMH/MEDQ  D:  07/23/2003  T:  07/23/2003  Job:  [id number]

## 2010-07-08 NOTE — Assessment & Plan Note (Signed)
[facility]                               PULMONARY OFFICE NOTE   ROSLAND, RIDING                   MRN:          [id number]  DATE:12/21/2005                            DOB:          [birth year]    [name] is a 45 year old African-American female with history of  moderate persistent asthma with significant atopic features.  She has been  on Xolair since July of 2005, taking 300 mg monthly.  She maintains Nexium  40 mg daily, Astelin two sprays each nostril b.i.d., Nasarel two sprays  daily each nostril, and now on Qvar 80 mcg strength two sprays b.i.d. in  lieu of Pulmicort.  Unfortunately she is only taking the Qvar daily.  She is  not using Astelin at all, nor is she using the Nasarel.  She has been  noncompliant in the past and has been difficult to keep up with in terms of  keeping her offices on a timely basis and being open and honest with regards  to her medication usage.  She has been receiving Xolair as we have  documentation of monthly injections of same.   PHYSICAL EXAMINATION:  VITAL SIGNS:  Temperature 97, blood pressure 110/80,  pulse 87, saturation 97% on room air.  CHEST:  Distant breath sounds with a few expiratory wheezes.  HEART:  Regular rate and rhythm without S3.  Normal S1 and S2.  ABDOMEN:  Soft, nontender.  EXTREMITIES:  No edema or clubbing.  SKIN:  Clear.  NEUROLOGY:  Intact.  HEENT:  No jugular venous distention, no lymphadenopathy.   Pulmonary functions are recently obtained and reviewed and reveal moderate  obstructive defect with FEV1 of 60% of predicted.  It does improve to 77%  following bronchodilator therapy.  Lung volumes and diffuse capacity are  normal.  There is evidence of severe peripheral air flow obstruction as  well.   IMPRESSION:  Moderate persistent asthma, reflux disease, chronic rhinitis,  and severe atopy, medical nonadherence.   PLAN:  We reviewed her medication profile and she is  to be on the Nasarel  two sprays each nostril daily, Nexium 40 mg daily, Xolair 300 mg monthly,  Qvar two sprays b.i.d. 80 mcg strength, Astelin two sprays each nostril  b.i.d., Synthroid 0.175 mcg daily.  She is to return to this office in 3  months' time.     Charlcie Cradle Delford Field, MD, [facility]  Electronically Signed    PEW/MedQ  DD: 12/21/2005  DT: 12/22/2005  Job #: [id number]   cc:   Titus Dubin. Alwyn Ren, MD,FACP,FCCP

## 2010-07-08 NOTE — Op Note (Signed)
NAME:  Krystal Scott, Krystal Scott                   ACCOUNT NO.:  [id number]   MEDICAL RECORD NO.:  [id number]                   Scott TYPE:  MAT   LOCATION:  [facility]                                 FACILITY:  [facility]   PHYSICIAN:  Duke Salvia. Marcelle Overlie, M.D.            DATE OF BIRTH:  21-Jun-1965   DATE OF PROCEDURE:  07/30/2003  DATE OF DISCHARGE:                                 OPERATIVE REPORT   PREOPERATIVE DIAGNOSES:  Failed induction at term.   POSTOPERATIVE DIAGNOSES:  1. Failed induction at term.  2. Nuchal cord x1.  3. Leiomyoma.   PROCEDURE:  Low transverse cesarean section, myomectomy.   SURGEON:  Duke Salvia. Marcelle Overlie, M.D.   ANESTHESIA:  Spinal.   COMPLICATIONS:  None.   DRAINS:  Foley catheter.   ESTIMATED BLOOD LOSS:  900.   DESCRIPTION OF PROCEDURE:  Krystal Scott was taken to Krystal operating room and  after an adequate level of spinal anesthesia was obtained with Krystal Scott  supine, Krystal abdomen prepped and draped in Krystal usual manner for sterile  __________ procedure, Foley catheter positioned draining clear urine,  transverse Pfannenstiel incision made 2 fingerbreadths above Krystal symphysis  carried down Krystal fascia which was excised and extended transversely. Krystal  rectus muscle divided in Krystal midline, peritoneum entered superiorly without  incident and extended in a vertical fashion. Krystal bladder blade was  positioned, Krystal vesicouterine serosa was then incised and Krystal bladder was  bluntly and sharply dissected off of Krystal lower uterine segment and Krystal  bladder blade repositioned. A transverse incision was made in Krystal lower  segment and extended with bandage scissors, clear fluid noted.  Krystal Scott  delivered a healthy female 7 pounds 3 ounces, loose nuchal cord x1 was  reduced. Krystal infant was suctioned, cord clamped and passed to Krystal pediatric  team for further care. Cord pH was obtained and was 7.26.  Placenta removed  manually intact and was sent to pathology. Krystal uterus was  exteriorized,  cavity wiped clean with a laparotomy pack. Closure obtained with a first  layer of #0 chromic __________fascia followed by an imbricating layer of #0  chromic. This was hemostatic, Krystal bladder flap area was intact and  hemostatic. Tubes and ovaries were normal. 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 These were excised at  Krystal surface and Krystal cut edges were sutured with interrupted 3-0 Dexon  sutures. These were areas were hemostatic and were covered with intercede  which was tacked at four corners to hold it in place with 3-0 Vicryl suture.  Krystal pelvis was then irrigated with saline and aspirated.  Krystal operative site  was hemostatic.  Prior to closure, sponge, needle and instrument counts were  reported as correct x2, peritoneum closed with a running 2-0 Dexon suture.  Krystal fascia was closed from laterally to Krystal  midline on either side with a #0 PDS suture. Krystal subcutaneous  site was  hemostatic, clips and Steri-Strips used on Krystal skin. Krystal Scott tolerated this well  and went to Krystal recovery room in good condition. Krystal Scott did receive Ancef 1 g  IV after Krystal cord was clamped along with Pitocin.                                               Richard M. Marcelle Overlie, M.D.    RMH/MEDQ  D:  07/30/2003  T:  07/31/2003  Job:  [id number]

## 2010-07-08 NOTE — Assessment & Plan Note (Signed)
[facility]                             PULMONARY OFFICE NOTE   Krystal Scott, Krystal Scott                   MRN:          [id number]  DATE:03/15/2006                            DOB:          February 25, 1965    HISTORY OF PRESENT ILLNESS:  The patient is a pleasant 45 year old  African-American female patient of Dr. Lynelle Doctor who has a known history  of moderate persistent asthma with significant atopic features.  The  patient has been on Xolair since July 2005.  The patient presents today  complaining of a 1-week history of nasal congestion, sinus pain and  pressure, and thick yellowish-green discharge and cough.  Prior to her  above illness, the patient reports she has been doing exceptionally well  without any flare of symptoms and no increased rescue albuterol use.  She is on Qvar 80 two puffs twice daily.  The patient does report on  occasion she does forget to take her second dose.  The patient denies  any hemoptysis, chest pain, orthopnea, PND or leg swelling.   PAST MEDICAL HISTORY:  Reviewed.   CURRENT MEDICATIONS:  Reviewed.   PHYSICAL EXAMINATION:  GENERAL:  The patient is a pleasant female in no  acute distress.  She is afebrile with stable vital signs.  O2 saturation  is 97% on room air.  HEENT:  Nasal mucosa is swollen and erythematous.  Maxillary sinus  tenderness.  Both TMs show some mild redness.  Posterior pharynx is  clear.  NECK:  Supple without cervical adenopathy.  No JVD.  LUNG SOUNDS:  Clear.  CARDIAC:  Regular rate.  ABDOMEN:  Soft and nontender.  EXTREMITIES:  Warm without any edema.   IMPRESSION AND PLAN:  1. Acute rhinosinusitis.  The patient is to begin Augmentin x10 days,      Mucinex DM twice a day, continue on a nasal hygiene regimen with      saline, Afrin, and Nasarel.  The patient is to return here back      with Dr. Delford Field in 4-6 weeks or sooner if needed.  2. Asthma.  The patient is educated on medication  adherence.     Rubye Oaks, NP  Electronically Signed      Charlcie Cradle Delford Field, MD, [facility]  Electronically Signed   TP/MedQ  DD: 03/15/2006  DT: 03/15/2006  Job #: [id number]

## 2010-07-08 NOTE — Assessment & Plan Note (Signed)
[facility]                             PULMONARY OFFICE NOTE   ADLEE, PAAR                   MRN:          [id number]  DATE:04/26/2006                            DOB:          12-28-1965    Ms. Thorton, returns today, is a 45 year old African American female  with moderate to severe persistent asthma with fixed air flow  obstruction.   1. She is on Xolair therapy since July 2005, at 300 mg monthly.  2. She is on the QVAR 2 sprays b.i.d. at 80 mcg strength, but she is      only using it once daily.  3. Astelin 2 sprays b.i.d. which she is using twice daily.  4. Nexium 40 mg a day.  5. Nasarel daily, 2 sprays each nostril.  6. Synthroid 175 mcg daily.   She continues to have difficulty utilizing the QVAR twice daily.  We  have had this consistent problem over and over again with her medication  compliance.  She saw Dr. Sherene Sires and the nurse practitioner in  [place] and had some similar issues with these visits.  I again  reinforced that extreme need to use the QVAR on a b.i.d. dosing basis.   PHYSICAL EXAMINATION:  VITAL SIGNS:  Temperature is 97.8, blood pressure  122/80, pulse 86, saturation 98% room air.  CHEST:  Showed to be clear today, except for wheezing with forced  exhalation.  CARDIAC:  Showed a regular rate and rhythm without S3.  Normal S1 S2.  ABDOMEN:  Soft, nontender.  EXTREMITIES:  Showed no edema, clubbing, or venous disease.  SKIN:  Clear.   Pulmonary functions were obtained, showed moderate severe obstructive  defect.  Again, the FEF 2575 is low at 20% predicted, FEV1 is at 58%  predicted, and the FEV1/FVC ratio is at 59% predicted.  These values are  very similar to October 2007 values.   IMPRESSION:  Moderate persistent asthma with fixed airflow obstruction  and airway remodeling, need for medication compliance but long-standing  history of medication non adherence.   PLAN:  1. The patient again was  strongly encouraged to increase the QVAR back      to 2 sprays b.i.d., 80 mcg strength.  2. She is to maintain Nasarel daily, Nexium daily, Astelin twice      daily, and the Xolair monthly.  3. She will utilize the albuterol p.r.n. only.  She claims she has not      been using albuterol      recently.  4. She will return to this office for a repeat visit in 3 months.     Charlcie Cradle Delford Field, MD, [facility]  Electronically Signed    PEW/MedQ  DD: 04/26/2006  DT: 04/26/2006  Job #: [id number]   cc:   Titus Dubin. Alwyn Ren, MD,FACP,FCCP

## 2010-07-08 NOTE — Discharge Summary (Signed)
NAME:  Krystal Scott, Krystal Scott                      ACCOUNT NO.:  [id number]   MEDICAL RECORD NO.:  [id number]                   PATIENT TYPE:  INP   LOCATION:  9134                                 FACILITY:  [facility]   PHYSICIAN:  Michelle L. Vincente Poli, M.D.            DATE OF BIRTH:  1965/07/15   DATE OF ADMISSION:  07/29/2003  DATE OF DISCHARGE:  08/03/2003                                 DISCHARGE SUMMARY   ADMITTING DIAGNOSES:  1. Intrauterine pregnancy at term.  2. Induction of labor.   DISCHARGE DIAGNOSES:  1. Status post low transverse cesarean section secondary to failure to     progress.  2. Viable female infant.   PROCEDURE:  1. Primary low transverse cesarean section.  2. Myomectomy.   REASON FOR ADMISSION:  Please see dictated H&P.   HOSPITAL COURSE:  The patient was a 45 year old gravida 3 para 0 that was  admitted to [facility] at 39 weeks estimated gestational  age for induction of labor at term.  Her pregnancy had been relatively  uncomplicated other than she did have asthma.  She was being followed by her  asthma doctor, Dr. Danise Mina.  The patient had noted to have several early  episodes during her pregnancy of asthma but she had done well on her current  medications.  Induction of labor was done electively at term because her  husband is in the [employer] and would be [place] soon [place].  On the  morning of admission Pitocin was administered per protocol.  After several  hours of labor without noted change in cervix - which was closed, firm and  posterior - the patient and husband elected for a cesarean delivery.  The  patient was then transferred to the operating room where spinal anesthesia  was administered without difficulty.  A low transversae incision was made  with the delivery of a female infant weighing 7 pounds 3 ounces with Apgars of  8 at one minute and 9 at five minutes.  Uterine fibroids were also noted x4  which were removed without  difficulty.  The patient tolerated the procedure  well and was taken to the recovery room in stable condition.  On  postoperative day #1 the patient was without complaint.  Vital signs were  stable, blood pressure 155/88.  Abdomen was soft with sluggish bowel sounds.  Fundus was firm and nontender.  Abdominal dressing was noted to be clean,  dry, and intact.  Labs revealed hemoglobin of 11.1 and platelet count  171,000; wbc count 10.7.  The patient was restarted on her asthma inhalers  per Dr. Lynelle Doctor orders.  On postoperative day #2 vital signs were stable.  Abdomen was soft, fundus was firm and nontender.  Abdominal dressing had  been removed revealing an incision that was clean, dry, and intact.  She was  ambulating well and tolerating a regular diet without complaints of nausea  and vomiting.  On postoperative  day #3 the patient was without complaint.  Vital signs remained stable.  Fundus was firm, incision was clean, dry, and  intact.  On postoperative day #4 the patient was without complaint.  Vital  signs were stable.  Abdomen was soft, fundus was firm and nontender.  Incision was clean, dry, and intact.  Staples were removed and the patient  was discharged home.   CONDITION ON DISCHARGE:  Good.   DIET:  Regular as tolerated.   ACTIVITY:  No heavy lifting, no driving x2 weeks, no vaginal entry.   FOLLOW-UP:  The patient is to follow up in the office in 1 week for an  incision check.  She is to call for temperature greater than 100 degrees,  persistent nausea and vomiting, heavy vaginal bleeding, and/or redness or  drainage from the incisional site.   DISCHARGE MEDICATIONS:  1. Synthroid one p.o. daily.  2. Floredil one puff b.i.d.  3. Pulmicort.  4. Protonix 40 mg one p.o. daily.  5. Percocet 5/325 #30 one p.o. q.4-6h. p.r.n.  6. Ibuprofen 600 mg q.6h.  7. Prenatal vitamins one p.o. daily.  8. Colace one p.o. daily p.r.n.     Julio Sicks, N.P.                         Stann Mainland. Vincente Poli, M.D.    CC/MEDQ  D:  08/17/2003  T:  08/18/2003  Job:  [id number]

## 2010-07-13 ENCOUNTER — Telehealth: Payer: Self-pay | Admitting: Critical Care Medicine

## 2010-07-13 NOTE — Telephone Encounter (Signed)
[email or web address].Jennifer Castillo, CMA  

## 2010-07-14 NOTE — Telephone Encounter (Signed)
lmomtcb x1 

## 2010-07-15 NOTE — Telephone Encounter (Signed)
Spoke with pt. She states that her ins is changing and PW is an out of network provider and she is unable to come see PW.  She is requesting his recs on what she should do about this. Pls advise thanks

## 2010-07-15 NOTE — Telephone Encounter (Signed)
Spoke with pt and notified of recs per PW.  Pt verbalized understanding and states that she plans to keep appt pending with PW and will bring list of drs in her network and will discuss this with PW then.

## 2010-07-15 NOTE — Telephone Encounter (Signed)
She will have to see who is in her network , we will then refer Or she can pay out of network copay>>higher copay

## 2010-07-21 ENCOUNTER — Ambulatory Visit: Payer: Self-pay | Admitting: Critical Care Medicine

## 2011-03-26 IMAGING — CR DG FOOT COMPLETE 3+V*L*
3 series · 3 of 3 positions shown · non-contrast
Comparison: None

CLINICAL DATA: Medial foot pain.

LEFT FOOT - COMPLETE 3+ VIEW

[view not recorded (1 of 3)]
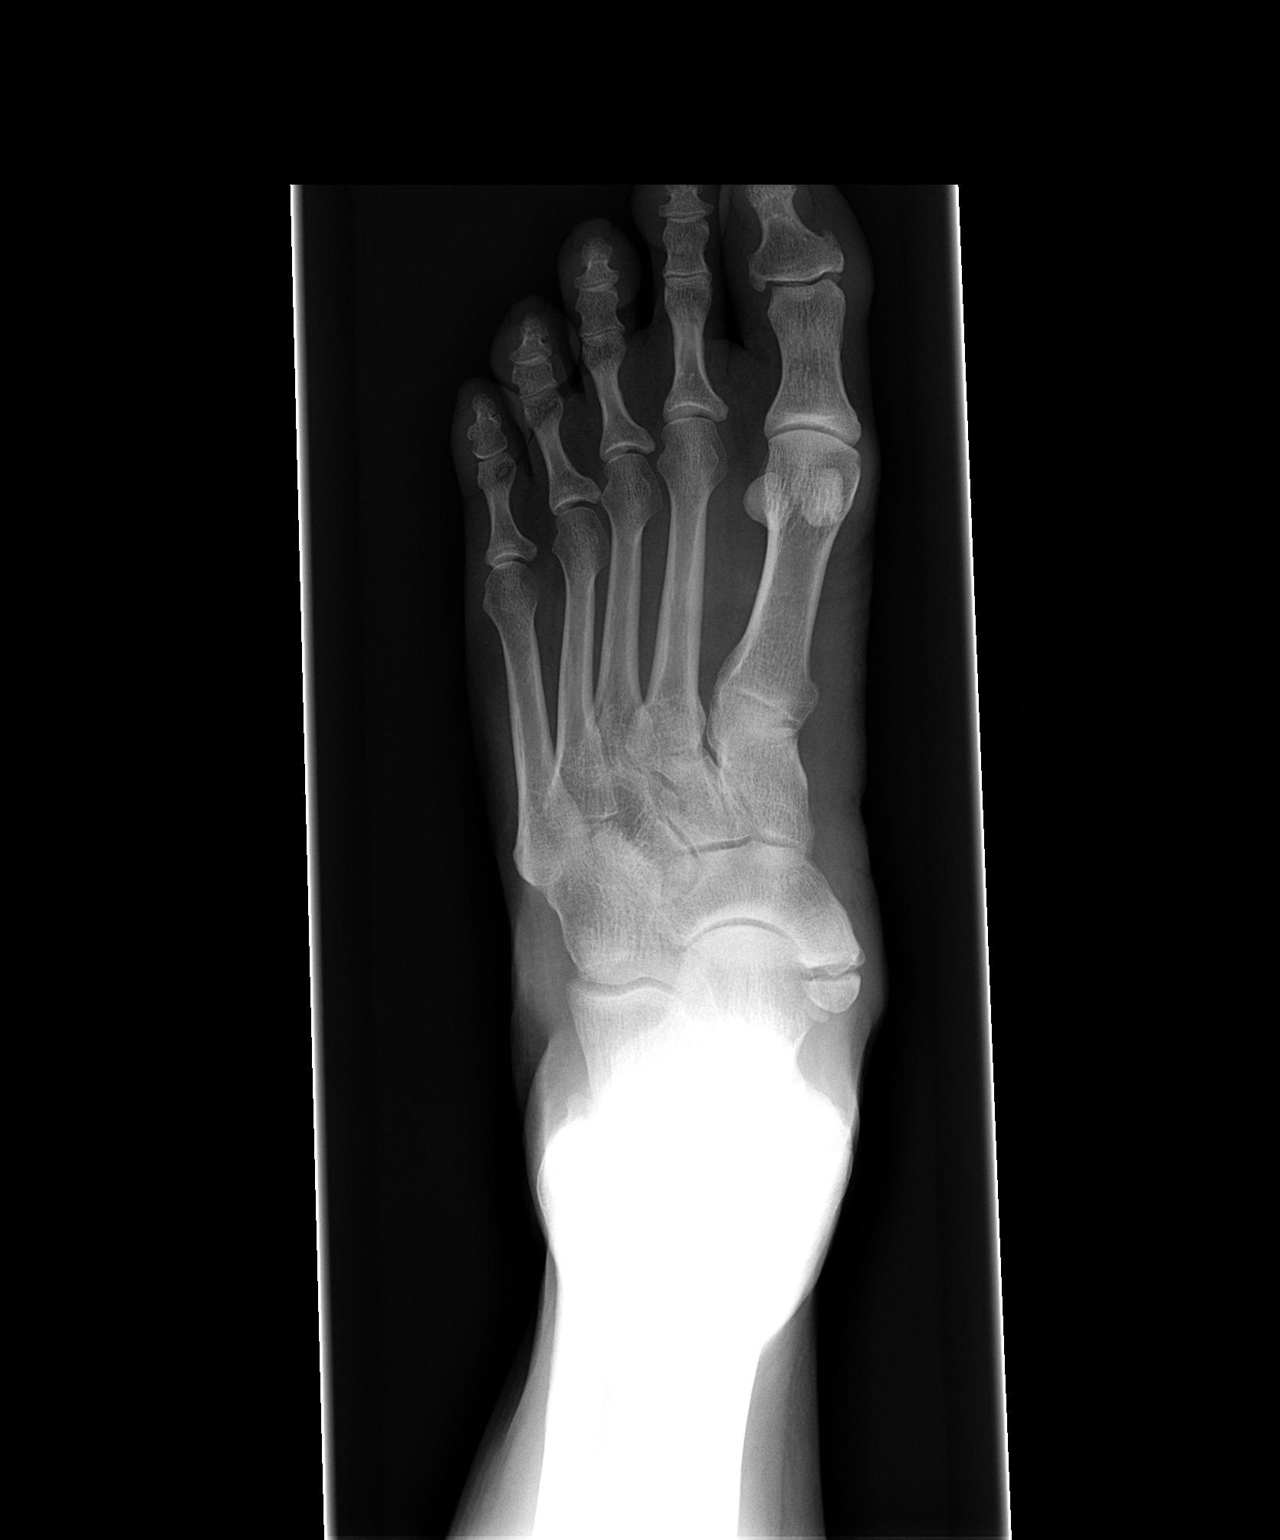

[view not recorded (2 of 3)]
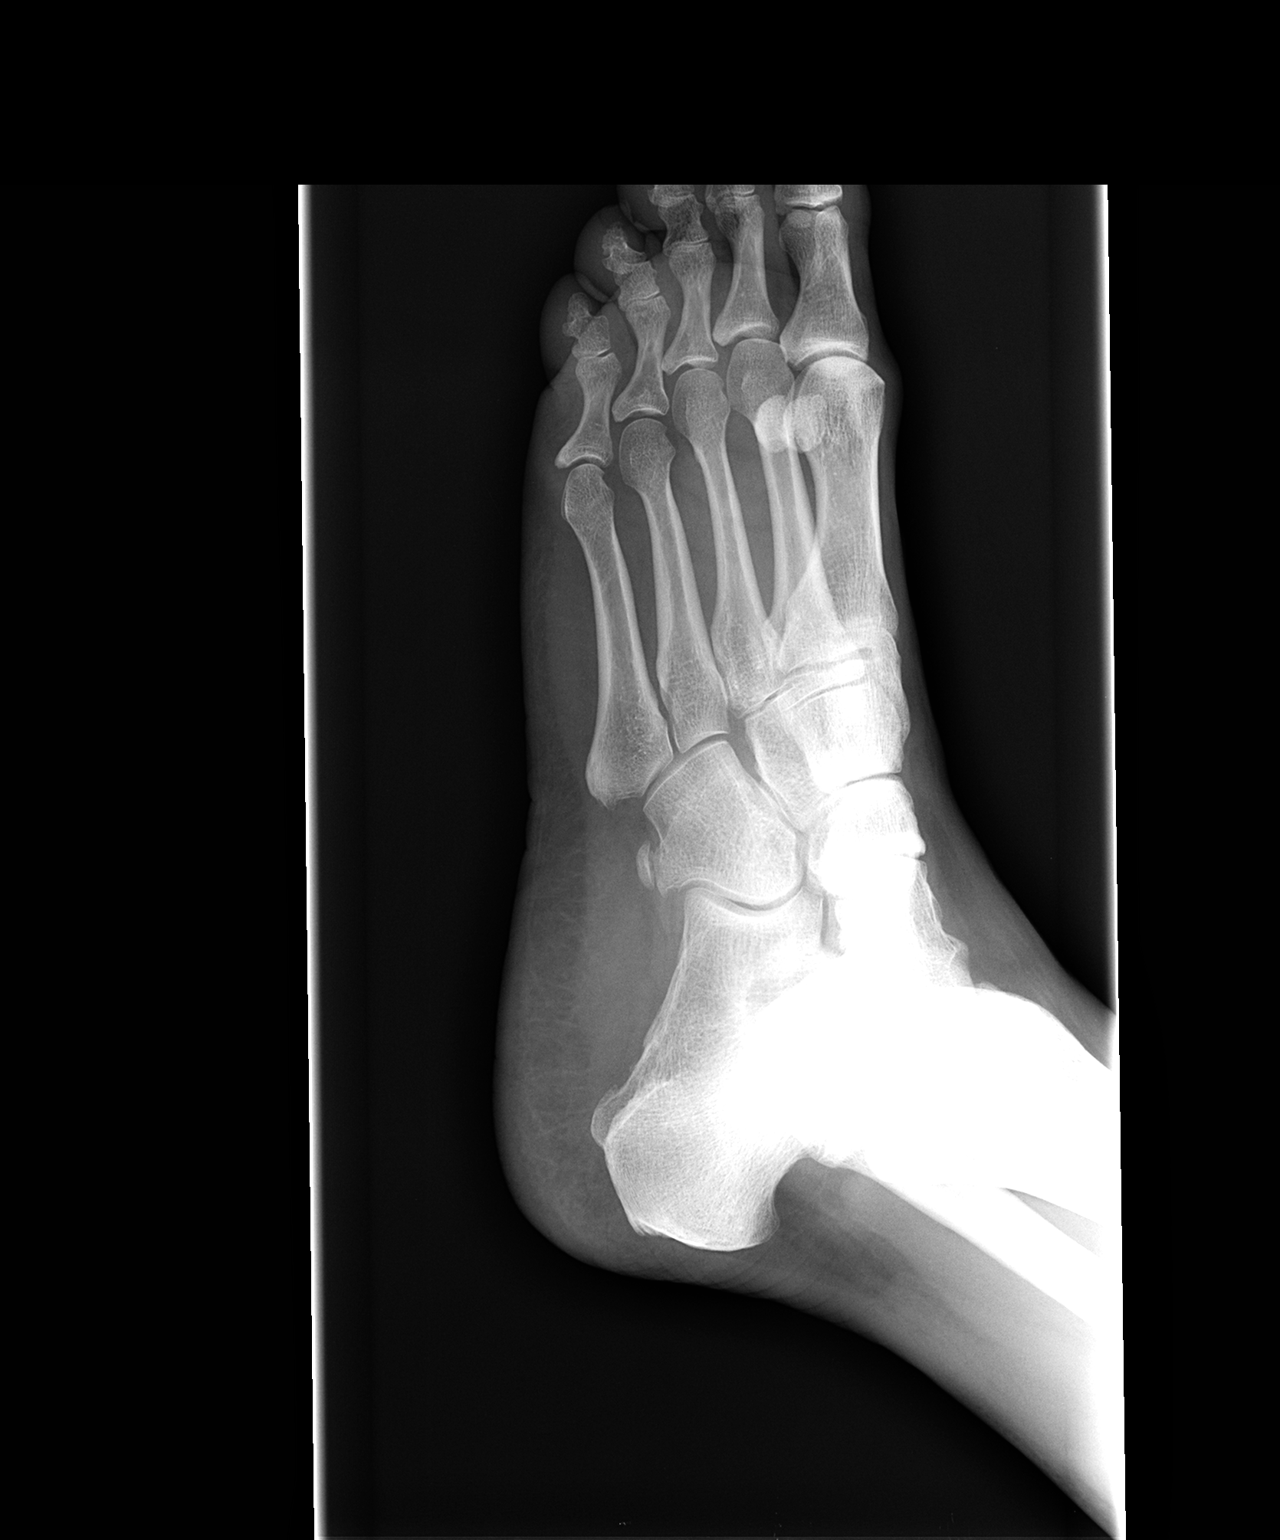

[view not recorded (3 of 3)]
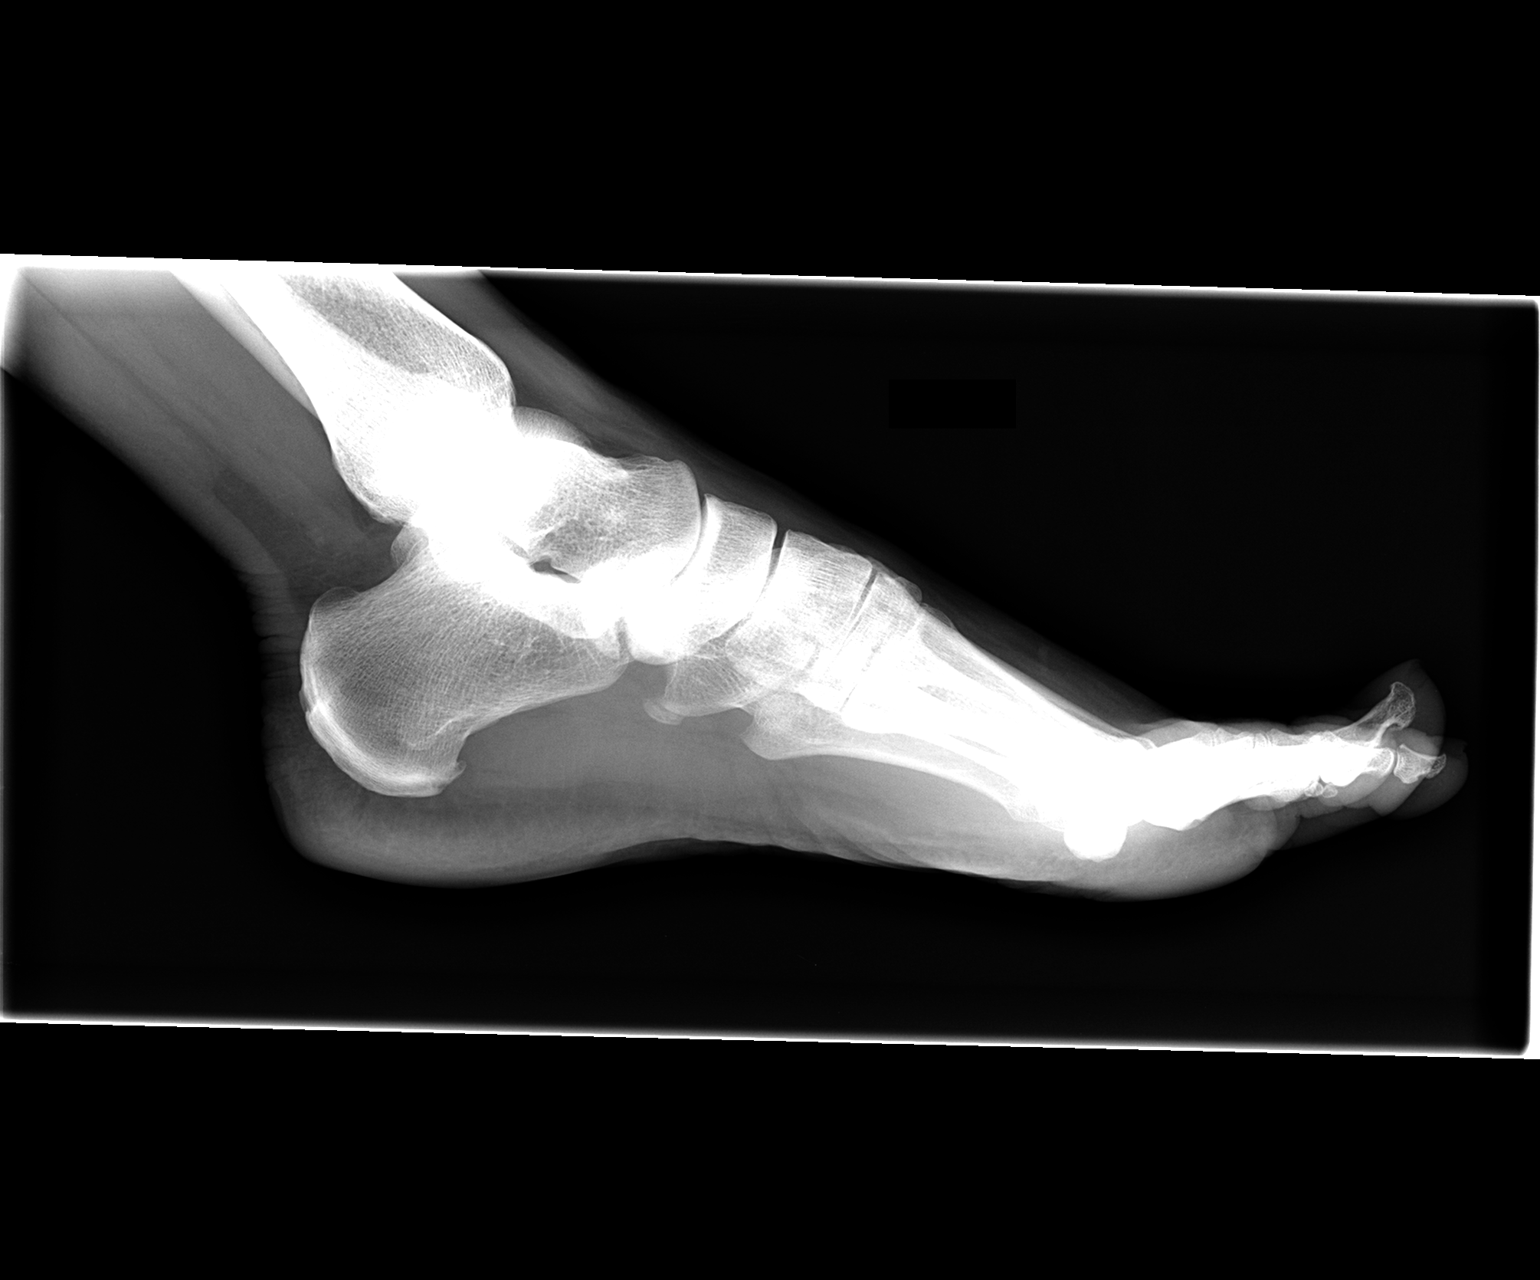

[3 of 3 positions shown; findings below may reference images not displayed]

FINDINGS: No acute bony abnormality.  Specifically, no fracture,
subluxation, or dislocation.  Soft tissues are intact.
IMPRESSION: No acute bony abnormality.

## 2011-04-29 ENCOUNTER — Other Ambulatory Visit: Payer: Self-pay | Admitting: Critical Care Medicine

## 2011-05-02 NOTE — Telephone Encounter (Signed)
Pt last seen by PW 01/2010.  States she has changed [employer] now and not sure if we accept this insurance.  Spoke with [employer] with our billing dept.  Per Donaciano Eva, we do accept this insurance (pt is not the [employer] personal).  Pt states she will call insurance co back to clarify with them.  She will call office back to schedule OV with PW if they will cover.  Advised I will send in a 1 mo rx to pharm to last until this is taken care of but will need OV for further fills as she was last seen 01/2010.  She verbalized understanding of this and ok with this plan.

## 2011-06-21 ENCOUNTER — Other Ambulatory Visit: Payer: Self-pay | Admitting: Critical Care Medicine

## 2011-06-21 NOTE — Telephone Encounter (Signed)
Patient has not been seen since 01/27/2010 and must make an appt for any refills. Pt last filled this on 04/15/2009. DENIAL for Albuterol nebs sent to the pharmacy.

## 2011-08-29 ENCOUNTER — Other Ambulatory Visit: Payer: Self-pay | Admitting: Critical Care Medicine

## 2012-03-28 ENCOUNTER — Encounter: Payer: Self-pay | Admitting: Critical Care Medicine

## 2012-03-28 ENCOUNTER — Ambulatory Visit (INDEPENDENT_AMBULATORY_CARE_PROVIDER_SITE_OTHER): Admitting: Critical Care Medicine

## 2012-03-28 VITALS — BP 126/80 | HR 104 | Temp 97.3°F | Ht 63.5 in | Wt 174.0 lb

## 2012-03-28 DIAGNOSIS — J45902 Unspecified asthma with status asthmaticus: Secondary | ICD-10-CM

## 2012-03-28 DIAGNOSIS — I10 Essential (primary) hypertension: Secondary | ICD-10-CM | POA: Insufficient documentation

## 2012-03-28 DIAGNOSIS — J45909 Unspecified asthma, uncomplicated: Secondary | ICD-10-CM

## 2012-03-28 MED ORDER — MOMETASONE FURO-FORMOTEROL FUM 200-5 MCG/ACT IN AERO: 2.0000 | INHALATION_SPRAY | Freq: Two times a day (BID) | RESPIRATORY_TRACT | Status: DC

## 2012-03-28 MED ORDER — BECLOMETHASONE DIPROPIONATE 80 MCG/ACT IN AERS: 2.0000 | INHALATION_SPRAY | Freq: Two times a day (BID) | RESPIRATORY_TRACT | Status: DC

## 2012-03-28 MED ORDER — ACLIDINIUM BROMIDE 400 MCG/ACT IN AEPB: 1.0000 | INHALATION_SPRAY | Freq: Two times a day (BID) | RESPIRATORY_TRACT | Status: DC

## 2012-03-28 MED ORDER — PREDNISONE 10 MG PO TABS: ORAL_TABLET | ORAL | Status: DC

## 2012-03-28 NOTE — Patient Instructions (Addendum)
Get PFTs in 3-4 weeks at main office Increase Dulera to two puff twice daily Increase Qvar to two puff twice daily Trial Tudorza one puff twice daily , use samples Prednisone 10mg  Take 4 for two days three for two days two for two days one for two days (sent to pharmacy downstairs) Return 2 months

## 2012-03-28 NOTE — Progress Notes (Signed)
Subjective:    Patient ID: Krystal Scott, female    DOB: [birth year], 47 y.o.   MRN: [id number]  HPI This patient has not been seen since December 2011  03/28/2012 Insurance issues kept pt away since 12/11.  Pt tried to stay on asthma regimen.  Pt with a primary MD in [place]  For last two years: no xolair d/t insurance.  No ED visits except twice, last time >29months Pt had sinus issues/infection 12/13. 1/14 also flared up. Notes some heartburn with GERD, is on nexium   PUL ASTHMA HISTORY 03/28/2012  Symptoms Daily  Nighttime awakenings 0-2/month  Interference with activity Minor limitations  SABA use Daily  Exacerbations requiring oral steroids 2 or more / year     Past Medical History  Diagnosis Date  . Allergic rhinitis   . Asthma   . GERD (gastroesophageal reflux disease)   . Hypothyroidism   . Hypertension      Family History  Problem Relation Age of Onset  . Hypertension Mother   . Diabetes Father      History   Social History  . Marital Status: Married    Spouse Name: N/A    Number of Children: N/A  . Years of Education: N/A   Occupational History  . Not on file.   Social History Main Topics  . Smoking status: Never Smoker   . Smokeless tobacco: Never Used  . Alcohol Use: No  . Drug Use: No  . Sexually Active: Not on file   Other Topics Concern  . Not on file   Social History Narrative  . No narrative on file     Allergies  Allergen Reactions  . Avelox (Moxifloxacin Hcl In Nacl)     Couldn't eat for a week d/t sores on tongue. Sores very painful     Outpatient Prescriptions Prior to Visit  Medication Sig Dispense Refill  . albuterol (PROAIR HFA) 108 (90 BASE) MCG/ACT inhaler Inhale 2 puffs into the lungs every 4 (four) hours as needed.  3 Inhaler  0  . albuterol (PROVENTIL) (2.5 MG/3ML) 0.083% nebulizer solution Take 2.5 mg by nebulization every 6 (six) hours as needed.       Marland Kitchen levothyroxine (SYNTHROID, LEVOTHROID) 175 MCG tablet Take 175  mcg by mouth daily. Except on monday       . NEXIUM 40 MG capsule TAKE 1 CAPSULE EVERY DAY  30 capsule  0  . [DISCONTINUED] beclomethasone (QVAR) 80 MCG/ACT inhaler Inhale 3 puffs into the lungs 2 (two) times daily.  540 Act  0  . [DISCONTINUED] fluticasone (FLONASE) 50 MCG/ACT nasal spray 1-2 sprays each nostril daily as needed  3 Act  0  . [DISCONTINUED] Mometasone Furo-Formoterol Fum (DULERA) 200-5 MCG/ACT AERO Inhale 2 puffs into the lungs 2 (two) times daily.  3 Inhaler  0  . [DISCONTINUED] cyclobenzaprine (FLEXERIL) 10 MG tablet Take 10 mg by mouth 3 (three) times daily as needed.        . [DISCONTINUED] omalizumab (XOLAIR) 150 MG injection Inject 300 mg into the skin every 30 (thirty) days.         Last reviewed on 03/28/2012 12:37 PM by Storm Frisk, MD    Review of Systems Constitutional:   No  weight loss, night sweats,  Fevers, chills, fatigue, lassitude. HEENT:   No headaches,  Difficulty swallowing,  Tooth/dental problems,  Sore throat,                No sneezing, itching, ear  ache, ++nasal congestion,++ post nasal drip,   CV:  No chest pain,  Orthopnea, PND, swelling in lower extremities, anasarca, dizziness, palpitations  GI  No heartburn, indigestion, abdominal pain, nausea, vomiting, diarrhea, change in bowel habits, loss of appetite  Resp: Notes  shortness of breath with exertion not  at rest.  No excess mucus, no productive cough,  Notes  non-productive cough,  No coughing up of blood.  No change in color of mucus.  No wheezing.  No chest wall deformity  Skin: no rash or lesions.  GU: no dysuria, change in color of urine, no urgency or frequency.  No flank pain.  MS:  No joint pain or swelling.  No decreased range of motion.  No back pain.  Psych:  No change in mood or affect. No depression or anxiety.  No memory loss.     Objective:   Physical Exam  Filed Vitals:   03/28/12 1226  BP: 126/80  Pulse: 104  Temp: 97.3 F (36.3 C)  TempSrc: Oral  Height: 5'  3.5" (1.613 m)  Weight: 174 lb (78.926 kg)  SpO2: 98%    Gen: Pleasant, well-nourished, in no distress,  normal affect  ENT: No lesions,  mouth clear,  oropharynx clear, no postnasal drip, moderate nasal inflammation  Neck: No JVD, no TMG, no carotid bruits  Lungs: No use of accessory muscles, no dullness to percussion, expired wheezes  Cardiovascular: RRR, heart sounds normal, no murmur or gallops, no peripheral edema  Abdomen: soft and NT, no HSM,  BS normal  Musculoskeletal: No deformities, no cyanosis or clubbing  Neuro: alert, non focal  Skin: Warm, no lesions or rashes  No results found.       Assessment & Plan:   Extrinsic asthma, unspecified Severe persistent asthma with upper airway instability, reflux disease, and severe atopic No evidence of active sinusitis at this time. The patient had been previously very well with Xolair therapy but due to insurance reasons cannot access this treatment at this time. Plan 3 week therapeutic trial of Tudorza one puff twice daily Increased Dulera 200 mcg strength 2 puff twice daily Increase Qvar 80 mcg strength 2 puff twice daily  8 day pulse prednisone Obtain full set of pulmonary function studies    Updated Medication List Outpatient Encounter Prescriptions as of 03/28/2012  Medication Sig Dispense Refill  . albuterol (PROAIR HFA) 108 (90 BASE) MCG/ACT inhaler Inhale 2 puffs into the lungs every 4 (four) hours as needed.  3 Inhaler  0  . albuterol (PROVENTIL) (2.5 MG/3ML) 0.083% nebulizer solution Take 2.5 mg by nebulization every 6 (six) hours as needed.       . beclomethasone (QVAR) 80 MCG/ACT inhaler Inhale 2 puffs into the lungs 2 (two) times daily.  1 Inhaler    . Cholecalciferol (VITAMIN D) 2000 UNITS CAPS Take 1 capsule by mouth daily.      . fluticasone (FLONASE) 50 MCG/ACT nasal spray Place 2 sprays into the nose daily as needed.      Marland Kitchen levothyroxine (SYNTHROID, LEVOTHROID) 175 MCG tablet Take 175 mcg by mouth  daily. Except on monday       . mometasone-formoterol (DULERA) 200-5 MCG/ACT AERO Inhale 2 puffs into the lungs 2 (two) times daily.      . Multiple Vitamin (MULTIVITAMIN) tablet Take 1 tablet by mouth daily.      Marland Kitchen NEXIUM 40 MG capsule TAKE 1 CAPSULE EVERY DAY  30 capsule  0  . valsartan-hydrochlorothiazide (DIOVAN-HCT) 160-12.5 MG per tablet  Take 1 tablet by mouth daily.      . [DISCONTINUED] beclomethasone (QVAR) 80 MCG/ACT inhaler Inhale 3 puffs into the lungs 2 (two) times daily.  540 Act  0  . [DISCONTINUED] beclomethasone (QVAR) 80 MCG/ACT inhaler Inhale 2 puffs into the lungs daily.      . [DISCONTINUED] fluticasone (FLONASE) 50 MCG/ACT nasal spray 1-2 sprays each nostril daily as needed  3 Act  0  . [DISCONTINUED] Mometasone Furo-Formoterol Fum (DULERA) 200-5 MCG/ACT AERO Inhale 2 puffs into the lungs 2 (two) times daily.  3 Inhaler  0  . [DISCONTINUED] mometasone-formoterol (DULERA) 200-5 MCG/ACT AERO Inhale 2 puffs into the lungs daily.      . [DISCONTINUED] mometasone-formoterol (DULERA) 200-5 MCG/ACT AERO Inhale 2 puffs into the lungs daily.      . Aclidinium Bromide (TUDORZA PRESSAIR) 400 MCG/ACT AEPB Inhale 1 puff into the lungs 2 (two) times daily.  1 each  3  . predniSONE (DELTASONE) 10 MG tablet Take 4 for two days three for two days two for two days one for two days  20 tablet  0  . [DISCONTINUED] cyclobenzaprine (FLEXERIL) 10 MG tablet Take 10 mg by mouth 3 (three) times daily as needed.        . [DISCONTINUED] omalizumab (XOLAIR) 150 MG injection Inject 300 mg into the skin every 30 (thirty) days.

## 2012-03-28 NOTE — Assessment & Plan Note (Addendum)
Severe persistent asthma with upper airway instability, reflux disease, and severe atopic No evidence of active sinusitis at this time. The patient had been previously very well with Xolair therapy but due to insurance reasons cannot access this treatment at this time. Plan 3 week therapeutic trial of Tudorza one puff twice daily Increased Dulera 200 mcg strength 2 puff twice daily Increase Qvar 80 mcg strength 2 puff twice daily  8 day pulse prednisone Obtain full set of pulmonary function studies

## 2012-04-23 ENCOUNTER — Ambulatory Visit (INDEPENDENT_AMBULATORY_CARE_PROVIDER_SITE_OTHER): Admitting: Critical Care Medicine

## 2012-04-23 DIAGNOSIS — J45909 Unspecified asthma, uncomplicated: Secondary | ICD-10-CM

## 2012-04-23 LAB — PULMONARY FUNCTION TEST

## 2012-04-23 NOTE — Progress Notes (Signed)
PFT done today by Beacher May.

## 2012-04-29 ENCOUNTER — Telehealth: Payer: Self-pay | Admitting: Critical Care Medicine

## 2012-04-29 DIAGNOSIS — J45909 Unspecified asthma, uncomplicated: Secondary | ICD-10-CM

## 2012-04-29 NOTE — Telephone Encounter (Signed)
PFT results called to the patient, show moderate obstruction with reversable component. Normal Lung volumes and DLCO Pt is aware and is doing better She needs an OV in April for f/u

## 2012-04-30 ENCOUNTER — Encounter: Payer: Self-pay | Admitting: Critical Care Medicine

## 2012-04-30 NOTE — Telephone Encounter (Signed)
Called, spoke with pt.  We have scheduled her to see PW as a follow up on Monday, May 27, 2012 at 9 am in [place]. Pt aware of appt date, time, and location and voiced no further questions or concerns at this time.

## 2012-05-15 ENCOUNTER — Encounter: Payer: Self-pay | Admitting: Critical Care Medicine

## 2012-05-27 ENCOUNTER — Encounter: Payer: Self-pay | Admitting: Critical Care Medicine

## 2012-05-27 ENCOUNTER — Ambulatory Visit (INDEPENDENT_AMBULATORY_CARE_PROVIDER_SITE_OTHER): Admitting: Critical Care Medicine

## 2012-05-27 VITALS — BP 122/82 | HR 93 | Temp 98.7°F | Ht 63.5 in | Wt 178.0 lb

## 2012-05-27 DIAGNOSIS — J45909 Unspecified asthma, uncomplicated: Secondary | ICD-10-CM

## 2012-05-27 NOTE — Progress Notes (Signed)
Subjective:    Patient ID: Krystal Scott, female    DOB: 07/10/1965, 47 y.o.   MRN: [id number]  Asthma Her symptoms are aggravated by occupational exposure. Her past medical history is significant for asthma.   This patient has not been seen since December 2011  03/28/2012 Insurance issues kept pt away since 12/11.  Pt tried to stay on asthma regimen.  Pt with a primary MD in [place]  For last two years: no xolair d/t insurance.  No ED visits except twice, last time >45months Pt had sinus issues/infection 12/13. 1/14 also flared up. Notes some heartburn with GERD, is on nexium   05/27/2012 Since the last office visit the patient's been doing very well. The patient is now on Qvar and Dulera and is compliant. There is no excess rescue inhaler use. There is no excess cough or wheezing. Patient notes significant postnasal drainage do to significant allergies.  Pt denies any significant sore throat, nasal congestion or excess secretions, fever, chills, sweats, unintended weight loss, pleurtic or exertional chest pain, orthopnea PND, or leg swelling Pt denies any increase in rescue therapy over baseline, denies waking up needing it or having any early am or nocturnal exacerbations of coughing/wheezing/or dyspnea. Pt also denies any obvious fluctuation in symptoms with  weather or environmental change or other alleviating or aggravating factors    PUL ASTHMA HISTORY 05/27/2012 03/28/2012  Symptoms 0-2 days/week Daily  Nighttime awakenings - 0-2/month  Interference with activity No limitations Minor limitations  SABA use 0-2 days/wk Daily  Exacerbations requiring oral steroids 0-1 / year 2 or more / year     Past Medical History  Diagnosis Date  . Allergic rhinitis   . Asthma   . GERD (gastroesophageal reflux disease)   . Hypothyroidism   . Hypertension      Family History  Problem Relation Age of Onset  . Hypertension Mother   . Diabetes Father      History   Social History  .  Marital Status: Married    Spouse Name: N/A    Number of Children: N/A  . Years of Education: N/A   Occupational History  . Not on file.   Social History Main Topics  . Smoking status: Never Smoker   . Smokeless tobacco: Never Used  . Alcohol Use: No  . Drug Use: No  . Sexually Active: Not on file   Other Topics Concern  . Not on file   Social History Narrative  . No narrative on file     Allergies  Allergen Reactions  . Avelox (Moxifloxacin Hcl In Nacl)     Couldn't eat for a week d/t sores on tongue. Sores very painful     Outpatient Prescriptions Prior to Visit  Medication Sig Dispense Refill  . Aclidinium Bromide (TUDORZA PRESSAIR) 400 MCG/ACT AEPB Inhale 1 puff into the lungs 2 (two) times daily.  1 each  3  . albuterol (PROAIR HFA) 108 (90 BASE) MCG/ACT inhaler Inhale 2 puffs into the lungs every 4 (four) hours as needed.  3 Inhaler  0  . albuterol (PROVENTIL) (2.5 MG/3ML) 0.083% nebulizer solution Take 2.5 mg by nebulization every 6 (six) hours as needed.       . beclomethasone (QVAR) 80 MCG/ACT inhaler Inhale 2 puffs into the lungs 2 (two) times daily.  1 Inhaler    . Cholecalciferol (VITAMIN D) 2000 UNITS CAPS Take 1 capsule by mouth daily.      . fluticasone (FLONASE) 50 MCG/ACT nasal spray  Place 2 sprays into the nose daily as needed.      Marland Kitchen levothyroxine (SYNTHROID, LEVOTHROID) 175 MCG tablet Take 175 mcg by mouth daily. Except on monday       . mometasone-formoterol (DULERA) 200-5 MCG/ACT AERO Inhale 2 puffs into the lungs 2 (two) times daily.      . Multiple Vitamin (MULTIVITAMIN) tablet Take 1 tablet by mouth daily.      Marland Kitchen NEXIUM 40 MG capsule TAKE 1 CAPSULE EVERY DAY  30 capsule  0  . valsartan-hydrochlorothiazide (DIOVAN-HCT) 160-12.5 MG per tablet Take 1 tablet by mouth daily.      . predniSONE (DELTASONE) 10 MG tablet Take 4 for two days three for two days two for two days one for two days  20 tablet  0   No facility-administered medications prior to  visit.      Review of Systems  Constitutional:   No  weight loss, night sweats,  Fevers, chills, fatigue, lassitude. HEENT:   No headaches,  Difficulty swallowing,  Tooth/dental problems,  Sore throat,                No sneezing, itching, ear ache, ++nasal congestion,++ post nasal drip,   CV:  No chest pain,  Orthopnea, PND, swelling in lower extremities, anasarca, dizziness, palpitations  GI  No heartburn, indigestion, abdominal pain, nausea, vomiting, diarrhea, change in bowel habits, loss of appetite  Resp: Notes  shortness of breath with exertion not  at rest.  No excess mucus, no productive cough,  Notes  non-productive cough,  No coughing up of blood.  No change in color of mucus.  No wheezing.  No chest wall deformity  Skin: no rash or lesions.  GU: no dysuria, change in color of urine, no urgency or frequency.  No flank pain.  MS:  No joint pain or swelling.  No decreased range of motion.  No back pain.  Psych:  No change in mood or affect. No depression or anxiety.  No memory loss.     Objective:   Physical Exam   Filed Vitals:   05/27/12 0926  BP: 122/82  Pulse: 93  Temp: 98.7 F (37.1 C)  TempSrc: Oral  Height: 5' 3.5" (1.613 m)  Weight: 178 lb (80.74 kg)  SpO2: 97%    Gen: Pleasant, well-nourished, in no distress,  normal affect  ENT: No lesions,  mouth clear,  oropharynx clear, no postnasal drip, moderate nasal inflammation  Neck: No JVD, no TMG, no carotid bruits  Lungs: No use of accessory muscles, no dullness to percussion, Clear without wheezes  Cardiovascular: RRR, heart sounds normal, no murmur or gallops, no peripheral edema  Abdomen: soft and NT, no HSM,  BS normal  Musculoskeletal: No deformities, no cyanosis or clubbing  Neuro: alert, non focal  Skin: Warm, no lesions or rashes  No results found.       Assessment & Plan:   Severe persistent asthma with recurrent flare Severe persistent asthma with recurrent flare and allergic  rhinitis now improved Plan Maintain inhaled medications as prescribed Patient is advised to begin antihistamine orally daily    Updated Medication List Outpatient Encounter Prescriptions as of 05/27/2012  Medication Sig Dispense Refill  . Aclidinium Bromide (TUDORZA PRESSAIR) 400 MCG/ACT AEPB Inhale 1 puff into the lungs 2 (two) times daily.  1 each  3  . albuterol (PROAIR HFA) 108 (90 BASE) MCG/ACT inhaler Inhale 2 puffs into the lungs every 4 (four) hours as needed.  3 Inhaler  0  . albuterol (PROVENTIL) (2.5 MG/3ML) 0.083% nebulizer solution Take 2.5 mg by nebulization every 6 (six) hours as needed.       . beclomethasone (QVAR) 80 MCG/ACT inhaler Inhale 2 puffs into the lungs 2 (two) times daily.  1 Inhaler    . Cholecalciferol (VITAMIN D) 2000 UNITS CAPS Take 1 capsule by mouth daily.      . fluticasone (FLONASE) 50 MCG/ACT nasal spray Place 2 sprays into the nose daily as needed.      Marland Kitchen levothyroxine (SYNTHROID, LEVOTHROID) 175 MCG tablet Take 175 mcg by mouth daily. Except on monday       . mometasone-formoterol (DULERA) 200-5 MCG/ACT AERO Inhale 2 puffs into the lungs 2 (two) times daily.      . Multiple Vitamin (MULTIVITAMIN) tablet Take 1 tablet by mouth daily.      Marland Kitchen NEXIUM 40 MG capsule TAKE 1 CAPSULE EVERY DAY  30 capsule  0  . valsartan-hydrochlorothiazide (DIOVAN-HCT) 160-12.5 MG per tablet Take 1 tablet by mouth daily.      . [DISCONTINUED] predniSONE (DELTASONE) 10 MG tablet Take 4 for two days three for two days two for two days one for two days  20 tablet  0   No facility-administered encounter medications on file as of 05/27/2012.

## 2012-05-27 NOTE — Assessment & Plan Note (Signed)
Severe persistent asthma with recurrent flare and allergic rhinitis now improved Plan Maintain inhaled medications as prescribed Patient is advised to begin antihistamine orally daily

## 2012-05-27 NOTE — Patient Instructions (Addendum)
Use allegra, clariten, or zyrtec daily through mid to end of may No other medication changes Return 4 months

## 2012-07-24 ENCOUNTER — Telehealth: Payer: Self-pay | Admitting: Critical Care Medicine

## 2012-07-24 MED ORDER — MOMETASONE FURO-FORMOTEROL FUM 200-5 MCG/ACT IN AERO: 2.0000 | INHALATION_SPRAY | Freq: Two times a day (BID) | RESPIRATORY_TRACT | Status: DC

## 2012-07-24 MED ORDER — FLUTICASONE PROPIONATE 50 MCG/ACT NA SUSP: 2.0000 | Freq: Every day | NASAL | Status: DC | PRN

## 2012-07-24 MED ORDER — ALBUTEROL SULFATE HFA 108 (90 BASE) MCG/ACT IN AERS: 2.0000 | INHALATION_SPRAY | RESPIRATORY_TRACT | Status: DC | PRN

## 2012-07-24 MED ORDER — ACLIDINIUM BROMIDE 400 MCG/ACT IN AEPB: 1.0000 | INHALATION_SPRAY | Freq: Two times a day (BID) | RESPIRATORY_TRACT | Status: DC

## 2012-07-24 MED ORDER — ALBUTEROL SULFATE (2.5 MG/3ML) 0.083% IN NEBU: 2.5000 mg | INHALATION_SOLUTION | Freq: Four times a day (QID) | RESPIRATORY_TRACT | Status: DC | PRN

## 2012-07-24 NOTE — Telephone Encounter (Signed)
RX's have been sent for 90 day as requested. Nothing further was needed

## 2012-09-09 ENCOUNTER — Telehealth: Payer: Self-pay | Admitting: Critical Care Medicine

## 2012-09-09 MED ORDER — PREDNISONE 10 MG PO TABS: ORAL_TABLET | ORAL | Status: DC

## 2012-09-09 MED ORDER — AMOXICILLIN-POT CLAVULANATE 875-125 MG PO TABS: 1.0000 | ORAL_TABLET | Freq: Two times a day (BID) | ORAL | Status: DC

## 2012-09-09 NOTE — Telephone Encounter (Signed)
ATC, no answer, no voicemail. Jennifer Castillo, CMA  

## 2012-09-09 NOTE — Telephone Encounter (Signed)
Called, spoke with pt.  Informed her of below recs per Dr. Delford Field.  She verbalized understanding of this.  She is aware rxs sent to [facility] and is to call back if symptoms do not improve or worsen.

## 2012-09-09 NOTE — Telephone Encounter (Signed)
Pt returned call.  Pt is leaving town tomorrow.  Would like something called in for sinus issues before she leaves, please.  Antionette Fairy

## 2012-09-09 NOTE — Telephone Encounter (Signed)
Called, spoke with pt.  C/o sinus congestion with thick, yellow mucus, PND, sinus pressure, some wheezing, and increased SOB x 3 days.  Symptoms worse qhs and when bending over.  Is using chloreseptic spray and salt water gargles.  Is going out of town tomorrow - requesting rx to be called in.  Dr. Delford Field, pls advise.  Thank you.  [facility] [place]  Allergies  Allergen Reactions  . Avelox (Moxifloxacin Hcl In Nacl)     Couldn't eat for a week d/t sores on tongue. Sores very painful

## 2012-09-09 NOTE — Telephone Encounter (Signed)
Call in augmentin 875mg  bid x 7days Prednisone 10mg  Take 4 for two days three for two days two for two days one for two days #20

## 2012-10-17 ENCOUNTER — Ambulatory Visit (INDEPENDENT_AMBULATORY_CARE_PROVIDER_SITE_OTHER): Admitting: Critical Care Medicine

## 2012-10-17 ENCOUNTER — Encounter: Payer: Self-pay | Admitting: Critical Care Medicine

## 2012-10-17 VITALS — BP 124/70 | HR 74 | Temp 98.4°F | Ht 63.5 in | Wt 176.0 lb

## 2012-10-17 DIAGNOSIS — J45909 Unspecified asthma, uncomplicated: Secondary | ICD-10-CM

## 2012-10-17 MED ORDER — ACLIDINIUM BROMIDE 400 MCG/ACT IN AEPB: 1.0000 | INHALATION_SPRAY | Freq: Two times a day (BID) | RESPIRATORY_TRACT | Status: DC

## 2012-10-17 MED ORDER — BECLOMETHASONE DIPROPIONATE 80 MCG/ACT IN AERS: 2.0000 | INHALATION_SPRAY | Freq: Two times a day (BID) | RESPIRATORY_TRACT | Status: DC

## 2012-10-17 MED ORDER — MOMETASONE FURO-FORMOTEROL FUM 200-5 MCG/ACT IN AERO: 2.0000 | INHALATION_SPRAY | Freq: Two times a day (BID) | RESPIRATORY_TRACT | Status: DC

## 2012-10-17 NOTE — Progress Notes (Signed)
Subjective:    Patient ID: Krystal Scott, female    DOB: [birth year], 47 y.o.   MRN: [id number]  HPI This patient has not been seen since December 2011  10/17/2012 Chief Complaint  Patient presents with  . 4 month follow up    Breathing doing well overall.  No SOB, chest tightness, or cough at this time.  Does have a little wheezing.  C/o tudorza getting mainly on tongue instead of going into lungs.  Overall the patient improved with less shortness of breath no chest tightness or wheezing. Patient's had no significant nocturnal complaints. Pt denies any significant sore throat, nasal congestion or excess secretions, fever, chills, sweats, unintended weight loss, pleurtic or exertional chest pain, orthopnea PND, or leg swelling Pt denies any increase in rescue therapy over baseline, denies waking up needing it or having any early am or nocturnal exacerbations of coughing/wheezing/or dyspnea. Pt also denies any obvious fluctuation in symptoms with  weather or environmental change or other alleviating or aggravating factors    PUL ASTHMA HISTORY 10/17/2012 05/27/2012 03/28/2012  Symptoms >2 days/week 0-2 days/week Daily  Nighttime awakenings 0-2/month - 0-2/month  Interference with activity No limitations No limitations Minor limitations  SABA use > 2 days/wk--not > 1 x/day 0-2 days/wk Daily  Exacerbations requiring oral steroids 0-1 / year 0-1 / year 2 or more / year     Past Medical History  Diagnosis Date  . Allergic rhinitis   . Asthma   . GERD (gastroesophageal reflux disease)   . Hypothyroidism   . Hypertension      Family History  Problem Relation Age of Onset  . Hypertension Mother   . Diabetes Father      History   Social History  . Marital Status: Married    Spouse Name: N/A    Number of Children: N/A  . Years of Education: N/A   Occupational History  . Not on file.   Social History Main Topics  . Smoking status: Never Smoker   . Smokeless tobacco: Never  Used  . Alcohol Use: No  . Drug Use: No  . Sexual Activity: Not on file   Other Topics Concern  . Not on file   Social History Narrative  . No narrative on file     Allergies  Allergen Reactions  . Avelox [Moxifloxacin Hcl In Nacl]     Couldn't eat for a week d/t sores on tongue. Sores very painful     Outpatient Prescriptions Prior to Visit  Medication Sig Dispense Refill  . Cholecalciferol (VITAMIN D) 2000 UNITS CAPS Take 1 capsule by mouth daily.      . fluticasone (FLONASE) 50 MCG/ACT nasal spray Place 2 sprays into the nose daily as needed.  48 g  1  . levothyroxine (SYNTHROID, LEVOTHROID) 175 MCG tablet Take 175 mcg by mouth daily. Except on monday       . NEXIUM 40 MG capsule TAKE 1 CAPSULE EVERY DAY  30 capsule  0  . valsartan-hydrochlorothiazide (DIOVAN-HCT) 160-12.5 MG per tablet Take 1 tablet by mouth daily.      . Aclidinium Bromide (TUDORZA PRESSAIR) 400 MCG/ACT AEPB Inhale 1 puff into the lungs 2 (two) times daily.  3 each  1  . mometasone-formoterol (DULERA) 200-5 MCG/ACT AERO Inhale 2 puffs into the lungs 2 (two) times daily.  3 Inhaler  1  . albuterol (PROAIR HFA) 108 (90 BASE) MCG/ACT inhaler Inhale 2 puffs into the lungs every 4 (four) hours as needed.  3 Inhaler  1  . albuterol (PROVENTIL) (2.5 MG/3ML) 0.083% nebulizer solution Take 3 mLs (2.5 mg total) by nebulization every 6 (six) hours as needed.  1080 mL  1  . amoxicillin-clavulanate (AUGMENTIN) 875-125 MG per tablet Take 1 tablet by mouth 2 (two) times daily. X 7 days  14 tablet  0  . beclomethasone (QVAR) 80 MCG/ACT inhaler Inhale 2 puffs into the lungs 2 (two) times daily.  1 Inhaler    . Multiple Vitamin (MULTIVITAMIN) tablet Take 1 tablet by mouth daily.      . predniSONE (DELTASONE) 10 MG tablet Take 4 for two days three for two days two for two days one for two days  20 tablet  0   No facility-administered medications prior to visit.      Review of Systems Constitutional:   No  weight loss,  night sweats,  Fevers, chills, fatigue, lassitude. HEENT:   No headaches,  Difficulty swallowing,  Tooth/dental problems,  Sore throat,                No sneezing, itching, ear ache, +nasal congestion, no post nasal drip,   CV:  No chest pain,  Orthopnea, PND, swelling in lower extremities, anasarca, dizziness, palpitations  GI  No heartburn, indigestion, abdominal pain, nausea, vomiting, diarrhea, change in bowel habits, loss of appetite  Resp: Notes  shortness of breath with exertion not  at rest.  No excess mucus, no productive cough,  Notes  non-productive cough,  No coughing up of blood.  No change in color of mucus.  No wheezing.  No chest wall deformity  Skin: no rash or lesions.  GU: no dysuria, change in color of urine, no urgency or frequency.  No flank pain.  MS:  No joint pain or swelling.  No decreased range of motion.  No back pain.  Psych:  No change in mood or affect. No depression or anxiety.  No memory loss.     Objective:   Physical Exam   Filed Vitals:   10/17/12 1419  BP: 124/70  Pulse: 74  Temp: 98.4 F (36.9 C)  TempSrc: Oral  Height: 5' 3.5" (1.613 m)  Weight: 176 lb (79.833 kg)  SpO2: 99%    Gen: Pleasant, well-nourished, in no distress,  normal affect  ENT: No lesions,  mouth clear,  oropharynx clear, no postnasal drip, moderate nasal inflammation  Neck: No JVD, no TMG, no carotid bruits  Lungs: No use of accessory muscles, no dullness to percussion, Clear without wheezes  Cardiovascular: RRR, heart sounds normal, no murmur or gallops, no peripheral edema  Abdomen: soft and NT, no HSM,  BS normal  Musculoskeletal: No deformities, no cyanosis or clubbing  Neuro: alert, non focal  Skin: Warm, no lesions or rashes  No results found.       Assessment & Plan:   Severe persistent asthma with recurrent flare Severe persistent asthma with upper airway instability and associated reflux disease and significant atopic features stable at this  time Plan Maintain inhaled medications as prescribed The patient was advised to resume Qvar 2 puff twice daily     Updated Medication List Outpatient Encounter Prescriptions as of 10/17/2012  Medication Sig Dispense Refill  . Aclidinium Bromide 400 MCG/ACT AEPB Inhale 1 puff into the lungs 2 (two) times daily. Every other day  1 each  6  . Cholecalciferol (VITAMIN D) 2000 UNITS CAPS Take 1 capsule by mouth daily.      . fluticasone (FLONASE) 50 MCG/ACT  nasal spray Place 2 sprays into the nose daily as needed.  48 g  1  . levothyroxine (SYNTHROID, LEVOTHROID) 175 MCG tablet Take 175 mcg by mouth daily. Except on monday       . mometasone-formoterol (DULERA) 200-5 MCG/ACT AERO Inhale 2 puffs into the lungs 2 (two) times daily.  1 Inhaler  11  . NEXIUM 40 MG capsule TAKE 1 CAPSULE EVERY DAY  30 capsule  0  . valsartan-hydrochlorothiazide (DIOVAN-HCT) 160-12.5 MG per tablet Take 1 tablet by mouth daily.      . [DISCONTINUED] Aclidinium Bromide (TUDORZA PRESSAIR) 400 MCG/ACT AEPB Inhale 1 puff into the lungs 2 (two) times daily.  3 each  1  . [DISCONTINUED] Aclidinium Bromide 400 MCG/ACT AEPB Inhale 1 puff into the lungs. Every other day      . [DISCONTINUED] mometasone-formoterol (DULERA) 200-5 MCG/ACT AERO Inhale 2 puffs into the lungs 2 (two) times daily.  3 Inhaler  1  . albuterol (PROAIR HFA) 108 (90 BASE) MCG/ACT inhaler Inhale 2 puffs into the lungs every 4 (four) hours as needed.  3 Inhaler  1  . albuterol (PROVENTIL) (2.5 MG/3ML) 0.083% nebulizer solution Take 3 mLs (2.5 mg total) by nebulization every 6 (six) hours as needed.  1080 mL  1  . beclomethasone (QVAR) 80 MCG/ACT inhaler Inhale 2 puffs into the lungs 2 (two) times daily.  1 Inhaler  11  . [DISCONTINUED] amoxicillin-clavulanate (AUGMENTIN) 875-125 MG per tablet Take 1 tablet by mouth 2 (two) times daily. X 7 days  14 tablet  0  . [DISCONTINUED] beclomethasone (QVAR) 80 MCG/ACT inhaler Inhale 2 puffs into the lungs 2 (two) times  daily.  1 Inhaler    . [DISCONTINUED] Multiple Vitamin (MULTIVITAMIN) tablet Take 1 tablet by mouth daily.      . [DISCONTINUED] predniSONE (DELTASONE) 10 MG tablet Take 4 for two days three for two days two for two days one for two days  20 tablet  0   No facility-administered encounter medications on file as of 10/17/2012.

## 2012-10-17 NOTE — Patient Instructions (Addendum)
No change in medications. Return in         4 months 

## 2012-10-18 NOTE — Assessment & Plan Note (Signed)
Severe persistent asthma with upper airway instability and associated reflux disease and significant atopic features stable at this time Plan Maintain inhaled medications as prescribed The patient was advised to resume Qvar 2 puff twice daily

## 2012-10-24 ENCOUNTER — Telehealth: Payer: Self-pay | Admitting: *Deleted

## 2012-10-24 NOTE — Telephone Encounter (Signed)
Received Prior Auth request from [facility] on Dulera. Per fax from pharm, pt "must try advair first."   Dr. Delford Field, pls advise if [facility] can be changed to advair or would you like to attempt PA?   Thanks!

## 2012-10-24 NOTE — Telephone Encounter (Signed)
Need to do the prior auth and give pt samples

## 2012-10-25 NOTE — Telephone Encounter (Signed)
Avigail, do you have # for PA? thanks

## 2012-10-25 NOTE — Telephone Encounter (Signed)
I called [phone] to initiate PA. I was then giving # to call [phone] option 1 for the PA. I called 2nd #. I spoke with Candace. Dulera 200 MCG was approved from 09/25/12- indefinite. Case # [id number].  I called and made [facility] aware at [phone].

## 2012-12-06 ENCOUNTER — Telehealth: Payer: Self-pay | Admitting: *Deleted

## 2012-12-06 NOTE — Telephone Encounter (Signed)
Per PW pt needs High Dose flu shot if she has NOT already received a flu shot for this flu season.   We now have our High Dose flu shot shipment.   I have lmomtcb for pt to ask about this.  She can be put on allergy inj schedule if she still needs flu shot for this year.

## 2012-12-11 NOTE — Telephone Encounter (Signed)
Called, spoke with pt.  Pt states she did receive her flu shot already.  She received this approx 2 wks ago at her PCP office.  I have updated her chart to refect this information.  Pt aware and voiced no further questions or concerns at this time.

## 2013-01-22 ENCOUNTER — Ambulatory Visit: Admitting: Endocrinology

## 2013-01-22 DIAGNOSIS — Z0289 Encounter for other administrative examinations: Secondary | ICD-10-CM

## 2013-08-21 ENCOUNTER — Telehealth: Payer: Self-pay | Admitting: Critical Care Medicine

## 2013-08-21 NOTE — Telephone Encounter (Signed)
Called pt - went directly to VM. lmomtcb for pt.

## 2013-08-25 MED ORDER — ALBUTEROL SULFATE HFA 108 (90 BASE) MCG/ACT IN AERS: 2.0000 | INHALATION_SPRAY | RESPIRATORY_TRACT | Status: DC | PRN

## 2013-08-25 NOTE — Telephone Encounter (Signed)
[facility]buterol hfa [facility] for pt

## 2013-08-26 NOTE — Telephone Encounter (Signed)
Called and lmom to make her aware that the rx for the inhaler has been sent to her pharmacy.

## 2013-09-12 ENCOUNTER — Ambulatory Visit: Admitting: Critical Care Medicine

## 2013-09-22 ENCOUNTER — Ambulatory Visit: Admitting: Critical Care Medicine

## 2013-09-29 ENCOUNTER — Ambulatory Visit: Admitting: Critical Care Medicine

## 2013-10-28 ENCOUNTER — Encounter: Payer: Self-pay | Admitting: Internal Medicine

## 2013-11-24 ENCOUNTER — Telehealth: Payer: Self-pay | Admitting: Critical Care Medicine

## 2013-11-24 MED ORDER — ALBUTEROL SULFATE HFA 108 (90 BASE) MCG/ACT IN AERS: 2.0000 | INHALATION_SPRAY | RESPIRATORY_TRACT | Status: DC | PRN

## 2013-11-24 MED ORDER — BECLOMETHASONE DIPROPIONATE 80 MCG/ACT IN AERS: 2.0000 | INHALATION_SPRAY | Freq: Two times a day (BID) | RESPIRATORY_TRACT | Status: DC

## 2013-11-24 NOTE — Telephone Encounter (Signed)
#   [facility]  Called spoke with pt. She needs RX for QVAR [facility] [place] Pt [facility]. I have done so as well. Nothing further needed

## 2013-11-25 ENCOUNTER — Encounter (INDEPENDENT_AMBULATORY_CARE_PROVIDER_SITE_OTHER): Payer: Self-pay

## 2013-11-25 ENCOUNTER — Encounter: Payer: Self-pay | Admitting: Family Medicine

## 2013-11-25 ENCOUNTER — Ambulatory Visit (INDEPENDENT_AMBULATORY_CARE_PROVIDER_SITE_OTHER): Admitting: Family Medicine

## 2013-11-25 VITALS — BP 152/85 | HR 85 | Ht 64.0 in | Wt 181.0 lb

## 2013-11-25 DIAGNOSIS — M79669 Pain in unspecified lower leg: Secondary | ICD-10-CM

## 2013-11-25 DIAGNOSIS — M545 Low back pain, unspecified: Secondary | ICD-10-CM

## 2013-11-25 NOTE — Patient Instructions (Addendum)
Your ultrasound and exam are negative for shin splints and a stress fracture. Your pain is due to muscle spasms/strain of the calf muscles, anterior tibialis muscles. Strengthening is very important for this - calf raises and the theraband exercises 3 sets of 10 once a day for 6 weeks. Dr. Jari SportsmanScholls active series insoles. [facility]. Aleve 2 tabs twice a day with food OR ibuprofen 600mg  three times a day with food as needed for pain and inflammation. Icing as needed 15 minutes at a time. Continue with your running program as well.  Consider resting though if pain is bad enough that you are limping. Follow up with me in 1 month to 6 weeks.  Your back pain is either due to a small bulging disc or muscle spasms (or a combination of the two). These are treated similarly. Start physical therapy and home exercises for next 6 weeks. Heat 15 minutes at a time 3-4 times a day as needed. Aleve or ibuprofen as noted above. Consider muscle relaxant, pain medication if this becomes severe. Ok to continue with chiropractic care as well.

## 2013-11-26 ENCOUNTER — Encounter: Payer: Self-pay | Admitting: Family Medicine

## 2013-11-26 DIAGNOSIS — M79669 Pain in unspecified lower leg: Secondary | ICD-10-CM | POA: Insufficient documentation

## 2013-11-26 DIAGNOSIS — M545 Low back pain, unspecified: Secondary | ICD-10-CM | POA: Insufficient documentation

## 2013-11-26 NOTE — Assessment & Plan Note (Signed)
2/2 small bulging disc or muscle spasms (or a combination of the two).  Start physical therapy and home exercises.  Heat, nsaids.  Ok to continue with chiropractic care.  F/u in 1 month to 6 weeks.

## 2013-11-26 NOTE — Assessment & Plan Note (Addendum)
consistent with strain/spasms of calf and anterior tibialis muscles, not shin splints or stress fracture.  MSK u/s done today also shows no evidence of stress fracture of tibias.  Start with strengthening and stretching which was reviewed today.  Dr. Jari SportsmanScholls active series for better cushion and arch support.  NSAIDs, icing.  Ok to continue running.  [facility].  Follow up with me in 1 month to 6 weeks.

## 2013-11-26 NOTE — Progress Notes (Signed)
Patient ID: Krystal Scott, female   DOB: [birth year], 48 y.o.   MRN: [id number]  PCP: Truddie CrumbleSINTHUSEK,CHIRAPA  Subjective:   HPI: Patient is a 48 y.o. female here for bilateral shin pain, back pain.  Patient reports she recently took up running and plans to do a half marathon in January. Up to run/walking 4 miles a week. States when she recently got to 2nd-3rd mile her shins started to hurt and calves started cramping. No obvious injury or trauma. Felt some tingling in her shins. Not having pain currently - only bothers with running.  Also has history of low back pain, told she has a bulging disc in the past. Pain is central in low back. No radiation into legs. No bowel/bladder dysfunction. No numbness/tingling.  Past Medical History  Diagnosis Date  . Allergic rhinitis   . Asthma   . GERD (gastroesophageal reflux disease)   . Hypothyroidism   . Hypertension     Current Outpatient Prescriptions on File Prior to Visit  Medication Sig Dispense Refill  . albuterol (PROAIR HFA) 108 (90 BASE) MCG/ACT inhaler Inhale 2 puffs into the lungs every 4 (four) hours as needed.  3 Inhaler  2  . beclomethasone (QVAR) 80 MCG/ACT inhaler Inhale 2 puffs into the lungs 2 (two) times daily.  3 Inhaler  2  . levothyroxine (SYNTHROID, LEVOTHROID) 175 MCG tablet Take 175 mcg by mouth daily. Except on monday       . mometasone-formoterol (DULERA) 200-5 MCG/ACT AERO Inhale 2 puffs into the lungs 2 (two) times daily.  1 Inhaler  11  . NEXIUM 40 MG capsule TAKE 1 CAPSULE EVERY DAY  30 capsule  0  . valsartan-hydrochlorothiazide (DIOVAN-HCT) 160-12.5 MG per tablet Take 1 tablet by mouth daily.      . Aclidinium Bromide 400 MCG/ACT AEPB Inhale 1 puff into the lungs 2 (two) times daily. Every other day  1 each  6  . albuterol (PROVENTIL) (2.5 MG/3ML) 0.083% nebulizer solution Take 3 mLs (2.5 mg total) by nebulization every 6 (six) hours as needed.  1080 mL  1  . Cholecalciferol (VITAMIN D) 2000 UNITS CAPS  Take 1 capsule by mouth daily.      . fluticasone (FLONASE) 50 MCG/ACT nasal spray Place 2 sprays into the nose daily as needed.  48 g  1   No current facility-administered medications on file prior to visit.    Past Surgical History  Procedure Laterality Date  . Thyroidectomy      Allergies  Allergen Reactions  . Avelox [Moxifloxacin Hcl In Nacl]     Couldn't eat for a week d/t sores on tongue. Sores very painful    History   Social History  . Marital Status: Married    Spouse Name: N/A    Number of Children: N/A  . Years of Education: N/A   Occupational History  . Not on file.   Social History Main Topics  . Smoking status: Never Smoker   . Smokeless tobacco: Never Used  . Alcohol Use: No  . Drug Use: No  . Sexual Activity: Not on file   Other Topics Concern  . Not on file   Social History Narrative  . No narrative on file    Family History  Problem Relation Age of Onset  . Hypertension Mother   . Diabetes Father     BP 152/85  Pulse 85  Ht 5\' 4"  (1.626 m)  Wt 181 lb (82.101 kg)  BMI 31.05 kg/m2  Review of  Systems: See HPI above.    Objective:  Physical Exam:  Gen: NAD  Bilateral lower legs: No gross deformity, swelling, bruising. Minimal tenderness medial gastrocs and over anterior tibialis muscles.  No tibial tenderness throughout either leg. FROM ankles, knees without pain and 5/5 strength. Negative hop and fulcrum tests. No pain with syndesmotic compression, axial loading of heels. Ant drawer, talar tilts negative. overpronation bilaterally. NVI distally.  Back: No gross deformity, scoliosis. Mild TTP around L4-5 area midline but no focal bony tenderness or stepoffs.  Slight right and left of midline here also tender. FROM without pain. Strength LEs 5/5 all muscle groups.   2+ MSRs in patellar and achilles tendons, equal bilaterally. Negative SLRs. Sensation intact to light touch bilaterally. Negative logroll bilateral  hips Negative fabers and piriformis stretches.    Assessment & Plan:  1. Bilateral lower leg pain - consistent with strain/spasms of calf and anterior tibialis muscles, not shin splints or stress fracture.  MSK u/s done today also shows no evidence of stress fracture of tibias.  Start with strengthening and stretching which was reviewed today.  Dr. Jari Sportsman active series for better cushion and arch support.  NSAIDs, icing.  Ok to continue running.  [facility] at [facility].  Follow up with me in 1 month to 6 weeks.  2. Low back pain - 2/2 small bulging disc or muscle spasms (or a combination of the two).  Start physical therapy and home exercises.  Heat, nsaids.  Ok to continue with chiropractic care.  F/u in 1 month to 6 weeks.

## 2013-12-02 ENCOUNTER — Telehealth: Payer: Self-pay | Admitting: Critical Care Medicine

## 2013-12-02 NOTE — Telephone Encounter (Signed)
Attempted to call for PA---[phone] PT ID#  [id number]  For qvar 80  Will try tomorrow for the PA

## 2013-12-04 ENCOUNTER — Ambulatory Visit (INDEPENDENT_AMBULATORY_CARE_PROVIDER_SITE_OTHER): Admitting: Critical Care Medicine

## 2013-12-04 ENCOUNTER — Encounter: Payer: Self-pay | Admitting: Critical Care Medicine

## 2013-12-04 VITALS — BP 132/84 | HR 94 | Temp 98.0°F | Ht 63.5 in | Wt 185.0 lb

## 2013-12-04 DIAGNOSIS — J4550 Severe persistent asthma, uncomplicated: Secondary | ICD-10-CM

## 2013-12-04 DIAGNOSIS — Z23 Encounter for immunization: Secondary | ICD-10-CM

## 2013-12-04 MED ORDER — ALBUTEROL SULFATE (2.5 MG/3ML) 0.083% IN NEBU: 2.5000 mg | INHALATION_SOLUTION | Freq: Four times a day (QID) | RESPIRATORY_TRACT | Status: AC | PRN

## 2013-12-04 MED ORDER — FLUTICASONE PROPIONATE 50 MCG/ACT NA SUSP: 2.0000 | Freq: Every day | NASAL | Status: DC

## 2013-12-04 MED ORDER — MOMETASONE FURO-FORMOTEROL FUM 200-5 MCG/ACT IN AERO: INHALATION_SPRAY | RESPIRATORY_TRACT | Status: DC

## 2013-12-04 MED ORDER — FLUTICASONE FUROATE-VILANTEROL 200-25 MCG/INH IN AEPB: 1.0000 | INHALATION_SPRAY | Freq: Every day | RESPIRATORY_TRACT | Status: DC

## 2013-12-04 MED ORDER — ALBUTEROL SULFATE HFA 108 (90 BASE) MCG/ACT IN AERS: 2.0000 | INHALATION_SPRAY | RESPIRATORY_TRACT | Status: AC | PRN

## 2013-12-04 NOTE — Addendum Note (Signed)
Addended by: Kathi SimpersWISE, Jhayla Podgorski F on: 12/04/2013 11:06 AM   Modules accepted: Orders

## 2013-12-04 NOTE — Patient Instructions (Signed)
Stop tudorza Stop Qvar Stay on Dulera two puff twice daily until you get Breo, then try Breo and hold Dulera Breo one puff daily if your insurance will cover Refills sent on albuterol IgE lab today Return 3 months Flu vaccine given

## 2013-12-04 NOTE — Progress Notes (Signed)
Subjective:    Patient ID: Krystal Scott, female    DOB: 1966/02/21, 48 y.o.   MRN: [id number]  HPI  12/04/2013 Chief Complaint  Patient presents with  . Yearly Follow up    Training for half marathon in January 2016 - noticed an improvement in SOB.  Minor wheezing.  No cough or chest tightness/pain.  This patient has not been seen in over a year. The patient states she had difficulty achieving a return visit. The patient's level of dyspnea is at baseline. She is trying to train for a half marathon run in January 2016. There is minor wheezing. There is no cough or mucus production.  Pt denies any significant sore throat, nasal congestion or excess secretions, fever, chills, sweats, unintended weight loss, pleurtic or exertional chest pain, orthopnea PND, or leg swelling Pt denies any increase in rescue therapy over baseline, denies waking up needing it or having any early am or nocturnal exacerbations of coughing/wheezing/or dyspnea. Pt also denies any obvious fluctuation in symptoms with  weather or environmental change or other alleviating or aggravating factors      PUL ASTHMA HISTORY 12/04/2013 10/17/2012 05/27/2012 03/28/2012  Symptoms 0-2 days/week >2 days/week 0-2 days/week Daily  Nighttime awakenings 0-2/month 0-2/month - 0-2/month  Interference with activity No limitations No limitations No limitations Minor limitations  SABA use > 2 days/wk--not > 1 x/day > 2 days/wk--not > 1 x/day 0-2 days/wk Daily  Exacerbations requiring oral steroids - 0-1 / year 0-1 / year 2 or more / year     Review of Systems  Constitutional:   No  weight loss, night sweats,  Fevers, chills, fatigue, lassitude. HEENT:   No headaches,  Difficulty swallowing,  Tooth/dental problems,  Sore throat,                No sneezing, itching, ear ache, +nasal congestion, no post nasal drip,   CV:  No chest pain,  Orthopnea, PND, swelling in lower extremities, anasarca, dizziness, palpitations  GI  No  heartburn, indigestion, abdominal pain, nausea, vomiting, diarrhea, change in bowel habits, loss of appetite  Resp: Notes  shortness of breath with exertion not  at rest.  No excess mucus, no productive cough,  Notes  non-productive cough,  No coughing up of blood.  No change in color of mucus.  No wheezing.  No chest wall deformity  Skin: no rash or lesions.  GU: no dysuria, change in color of urine, no urgency or frequency.  No flank pain.  MS:  No joint pain or swelling.  No decreased range of motion.  No back pain.  Psych:  No change in mood or affect. No depression or anxiety.  No memory loss.     Objective:   Physical Exam   Filed Vitals:   12/04/13 1127  BP: 132/84  Pulse: 94  Temp: 98 F (36.7 C)  TempSrc: Oral  Height: 5' 3.5" (1.613 m)  Weight: 185 lb (83.915 kg)  SpO2: 97%    Gen: Pleasant, well-nourished, in no distress,  normal affect  ENT: No lesions,  mouth clear,  oropharynx clear, no postnasal drip, moderate nasal inflammation  Neck: No JVD, no TMG, no carotid bruits  Lungs: No use of accessory muscles, no dullness to percussion, mild expiratory wheeze Cardiovascular: RRR, heart sounds normal, no murmur or gallops, no peripheral edema  Abdomen: soft and NT, no HSM,  BS normal  Musculoskeletal: No deformities, no cyanosis or clubbing  Neuro: alert, non focal  Skin: Warm,  no lesions or rashes  No results found.       Assessment & Plan:   Asthma, severe persistent Severe persistent asthma with upper airway instability reflux disease and significant atopic features. This patient has struggled with compliance in the past. Plan Stop tudorza Stop Qvar Trial of Breo once daily to improve compliance and discontinue Dulera  IgE lab today Return 3 months Flu vaccine given     Updated Medication List Outpatient Encounter Prescriptions as of 12/04/2013  Medication Sig  . albuterol (PROAIR HFA) 108 (90 BASE) MCG/ACT inhaler Inhale 2 puffs into  the lungs every 4 (four) hours as needed.  Marland Kitchen. albuterol (PROVENTIL) (2.5 MG/3ML) 0.083% nebulizer solution Take 3 mLs (2.5 mg total) by nebulization every 6 (six) hours as needed.  . Cholecalciferol (VITAMIN D) 2000 UNITS CAPS Take 1 capsule by mouth daily.  . fluticasone (FLONASE) 50 MCG/ACT nasal spray Place 2 sprays into both nostrils daily.  Marland Kitchen. levothyroxine (SYNTHROID, LEVOTHROID) 175 MCG tablet Take 175 mcg by mouth daily.   . mometasone-formoterol (DULERA) 200-5 MCG/ACT AERO Use until get Breo then stop Dulera and start Breo  . NEXIUM 40 MG capsule TAKE 1 CAPSULE EVERY DAY  . valsartan-hydrochlorothiazide (DIOVAN-HCT) 160-12.5 MG per tablet Take 1 tablet by mouth daily.  . [DISCONTINUED] albuterol (PROAIR HFA) 108 (90 BASE) MCG/ACT inhaler Inhale 2 puffs into the lungs every 4 (four) hours as needed.  . [DISCONTINUED] albuterol (PROVENTIL) (2.5 MG/3ML) 0.083% nebulizer solution Take 3 mLs (2.5 mg total) by nebulization every 6 (six) hours as needed.  . [DISCONTINUED] fluticasone (FLONASE) 50 MCG/ACT nasal spray Place 2 sprays into the nose daily as needed.  . [DISCONTINUED] mometasone-formoterol (DULERA) 200-5 MCG/ACT AERO Inhale 2 puffs into the lungs 2 (two) times daily.  . [DISCONTINUED] mometasone-formoterol (DULERA) 200-5 MCG/ACT AERO Inhale 2 puffs into the lungs daily.  . Fluticasone Furoate-Vilanterol (BREO ELLIPTA) 200-25 MCG/INH AEPB Inhale 1 puff into the lungs daily.  . [DISCONTINUED] Aclidinium Bromide 400 MCG/ACT AEPB Inhale 1 puff into the lungs 2 (two) times daily. Every other day  . [DISCONTINUED] Aclidinium Bromide 400 MCG/ACT AEPB Inhale 1 puff into the lungs. Every other day  . [DISCONTINUED] beclomethasone (QVAR) 80 MCG/ACT inhaler Inhale 2 puffs into the lungs 2 (two) times daily.

## 2013-12-05 NOTE — Telephone Encounter (Signed)
The breo was already sent to her pharm yesterday

## 2013-12-05 NOTE — Telephone Encounter (Signed)
Correct # to call for PA is [phone] for [facility] pt.    Called to initiate the PA for the qvar.  This has been denied and pt will need to try and fail flovent discus  or flovent HFA. Marland Kitchen.  PW please advise. thanks

## 2013-12-05 NOTE — Assessment & Plan Note (Signed)
Severe persistent asthma with upper airway instability reflux disease and significant atopic features. This patient has struggled with compliance in the past. Plan Stop tudorza Stop Qvar Trial of Breo once daily to improve compliance and discontinue Dulera  IgE lab today Return 3 months Flu vaccine given

## 2013-12-05 NOTE — Telephone Encounter (Signed)
I discontined Qvar yesterday  I wanted her on Breo, ? What is status of this  Needs Breo 200 one puff daily

## 2013-12-10 LAB — IGE: IGE (IMMUNOGLOBULIN E), SERUM: 5110 kU/L — AB (ref <115)

## 2013-12-11 ENCOUNTER — Telehealth: Payer: Self-pay | Admitting: Critical Care Medicine

## 2013-12-11 NOTE — Telephone Encounter (Signed)
Per 12/04/13: Patient Instructions      Stop tudorza Stop Qvar Stay on Dulera two puff twice daily until you get Breo, then try Breo and hold Dulera Breo one puff daily if your insurance will cover Refills sent on albuterol IgE lab today Return 3 months Flu vaccine given     Called [facility] and made them aware QVAR was d/c'd. They reports they received RX for this on 10/15. Pt told them as well she was suppose to be started on breo but has not received this.  According to EPIC, [facility]. I called pt but NA and not able to leave VM to see where RX is suppose to be sent to. WCB

## 2013-12-15 MED ORDER — FLUTICASONE FUROATE-VILANTEROL 200-25 MCG/INH IN AEPB: 1.0000 | INHALATION_SPRAY | Freq: Every day | RESPIRATORY_TRACT | Status: DC

## 2013-12-15 NOTE — Telephone Encounter (Signed)
Called and spoke with pt and she stated that the breo was supposed to go to the mail order.  Spoke with pt and she is aware of her lab results per PW>

## 2013-12-16 ENCOUNTER — Ambulatory Visit (INDEPENDENT_AMBULATORY_CARE_PROVIDER_SITE_OTHER): Admitting: Physical Therapy

## 2013-12-16 DIAGNOSIS — M545 Low back pain: Secondary | ICD-10-CM

## 2013-12-16 DIAGNOSIS — M25559 Pain in unspecified hip: Secondary | ICD-10-CM

## 2013-12-18 NOTE — Progress Notes (Signed)
Quick Note:  See previous phone msg. Pt already aware of lab results. She will need to come by to sign forms to start xolair process. These forms are in my folder. [facility]? ______

## 2013-12-19 ENCOUNTER — Encounter: Admitting: Physical Therapy

## 2013-12-22 ENCOUNTER — Encounter (INDEPENDENT_AMBULATORY_CARE_PROVIDER_SITE_OTHER): Admitting: Physical Therapy

## 2013-12-22 ENCOUNTER — Telehealth: Payer: Self-pay | Admitting: *Deleted

## 2013-12-22 DIAGNOSIS — M25559 Pain in unspecified hip: Secondary | ICD-10-CM

## 2013-12-22 NOTE — Progress Notes (Signed)
Quick Note:  lmomtcb for pt ______ 

## 2013-12-22 NOTE — Telephone Encounter (Signed)
Error

## 2013-12-23 NOTE — Progress Notes (Signed)
Quick Note:  lmomtcb for pt ______ 

## 2013-12-25 ENCOUNTER — Encounter (INDEPENDENT_AMBULATORY_CARE_PROVIDER_SITE_OTHER): Admitting: Physical Therapy

## 2013-12-25 ENCOUNTER — Encounter: Payer: Self-pay | Admitting: Internal Medicine

## 2013-12-25 DIAGNOSIS — M25559 Pain in unspecified hip: Secondary | ICD-10-CM

## 2013-12-26 ENCOUNTER — Encounter: Payer: Self-pay | Admitting: *Deleted

## 2013-12-26 ENCOUNTER — Encounter

## 2013-12-26 NOTE — Progress Notes (Signed)
Quick Note:  Spoke with pt. She would like to have xolair form faxed to sign and will fax back to me. Advised if faxed copy will not work, I will let her know on Monday. She verbalized understanding, copy faxed to pt at [phone]. Will await fax back. ______

## 2013-12-26 NOTE — Progress Notes (Signed)
Quick Note:  lmomtcb for pt. Will also send letter to pt's home address given I have not been able to contact pt. ______

## 2013-12-29 ENCOUNTER — Encounter (INDEPENDENT_AMBULATORY_CARE_PROVIDER_SITE_OTHER): Admitting: Physical Therapy

## 2013-12-29 DIAGNOSIS — M25559 Pain in unspecified hip: Secondary | ICD-10-CM

## 2014-01-01 ENCOUNTER — Ambulatory Visit (AMBULATORY_SURGERY_CENTER): Payer: Self-pay | Admitting: *Deleted

## 2014-01-01 VITALS — Ht 63.5 in | Wt 185.2 lb

## 2014-01-01 DIAGNOSIS — Z1211 Encounter for screening for malignant neoplasm of colon: Secondary | ICD-10-CM

## 2014-01-01 MED ORDER — MOVIPREP 100 G PO SOLR: 1.0000 | Freq: Once | ORAL | Status: DC

## 2014-01-01 NOTE — Progress Notes (Signed)
Denies allergies to eggs or soy products. Denies complications with sedation or anesthesia. Denies O2 use. Denies use of diet or weight loss medications.  Emmi instructions given for colonoscopy.  

## 2014-01-02 ENCOUNTER — Encounter: Admitting: Physical Therapy

## 2014-01-05 ENCOUNTER — Encounter (INDEPENDENT_AMBULATORY_CARE_PROVIDER_SITE_OTHER): Admitting: Physical Therapy

## 2014-01-05 DIAGNOSIS — M545 Low back pain: Secondary | ICD-10-CM

## 2014-01-05 DIAGNOSIS — M25559 Pain in unspecified hip: Secondary | ICD-10-CM

## 2014-01-08 ENCOUNTER — Encounter: Admitting: Physical Therapy

## 2014-01-08 NOTE — Progress Notes (Signed)
Quick Note:  I have not received these forms from pt yet. lmomtcb for pt to check on status ______

## 2014-01-09 ENCOUNTER — Encounter: Payer: Self-pay | Admitting: Internal Medicine

## 2014-01-09 ENCOUNTER — Ambulatory Visit (AMBULATORY_SURGERY_CENTER): Admitting: Internal Medicine

## 2014-01-09 VITALS — BP 141/88 | HR 76 | Temp 97.0°F | Resp 20 | Ht 63.5 in | Wt 185.0 lb

## 2014-01-09 DIAGNOSIS — Z1211 Encounter for screening for malignant neoplasm of colon: Secondary | ICD-10-CM

## 2014-01-09 MED ORDER — SODIUM CHLORIDE 0.9 % IV SOLN: 500.0000 mL | INTRAVENOUS | Status: DC

## 2014-01-09 NOTE — Op Note (Signed)
[facility] [address]  [address] [place], [id number]   COLONOSCOPY PROCEDURE REPORT  PATIENT: Krystal Scott, Krystal Scott  MR#: [id number] BIRTHDATE: 1966-02-20 , 48  yrs. old GENDER: female ENDOSCOPIST: Beverley FiedlerJay M Ashle Stief, MD REFERRED BY: Truddie Crumblehirapa Sinthusek, MD PROCEDURE DATE:  01/09/2014 PROCEDURE:   Colonoscopy, screening First Screening Colonoscopy - Avg.  risk and is 50 yrs.  old or older Yes.  Prior Negative Screening - Now for repeat screening. [place]/A  History of Adenoma - Now for follow-up colonoscopy & has been > or = to 3 yrs.  Scott/A  Polyps Removed Today? No.  Polyps Removed Today? No.  Recommend repeat exam, <10 yrs? Polyps Removed Today? No.  Recommend repeat exam, <10 yrs? No. ASA CLASS:   Class II INDICATIONS:average risk for colorectal cancer. MEDICATIONS: Monitored anesthesia care and Propofol 270 mg IV  DESCRIPTION OF PROCEDURE:   After the risks benefits and alternatives of the procedure were thoroughly explained, informed consent was obtained.  The digital rectal exam revealed no abnormalities of the rectum.   The LB 1528  endoscope was introduced through the anus and advanced to the cecum, which was identified by both the appendix and ileocecal valve. No adverse events experienced.   The quality of the prep was good, using MoviPrep  The instrument was then slowly withdrawn as the colon was fully examined.      COLON FINDINGS: A normal appearing cecum, ileocecal valve, and appendiceal orifice were identified.  The ascending, transverse, descending, sigmoid colon, and rectum appeared unremarkable. Retroflexed views revealed no abnormalities. The time to cecum=4 minutes 07 seconds.  Withdrawal time=7 minutes 27 seconds.  The scope was withdrawn and the procedure completed. COMPLICATIONS: There were no immediate complications.   ENDOSCOPIC IMPRESSION: Normal colonoscopy  RECOMMENDATIONS: You should continue to follow colorectal cancer screening guidelines for  "routine risk" patients with a repeat colonoscopy in 10 years. There is no need for FOBT (stool) testing for at least 5 years.  eSigned:  Beverley FiedlerJay M Yeilin Zweber, MD 01/09/2014 12:10 PM   cc: The Patient, PCP

## 2014-01-09 NOTE — Patient Instructions (Signed)
YOU HAD AN ENDOSCOPIC PROCEDURE TODAY AT THE [facility]: Refer to the procedure report that was given to you for any specific questions about what was found during the examination.  If the procedure report does not answer your questions, please call your gastroenterologist to clarify.  If you requested that your care partner not be given the details of your procedure findings, then the procedure report has been included in a sealed envelope for you to review at your convenience later.  YOU SHOULD EXPECT: Some feelings of bloating in the abdomen. Passage of more gas than usual.  Walking can help get rid of the air that was put into your GI tract during the procedure and reduce the bloating. If you had a lower endoscopy (such as a colonoscopy or flexible sigmoidoscopy) you may notice spotting of blood in your stool or on the toilet paper. If you underwent a bowel prep for your procedure, then you may not have a normal bowel movement for a few days.  DIET: Your first meal following the procedure should be a light meal and then it is ok to progress to your normal diet.  A half-sandwich or bowl of soup is an example of a good first meal.  Heavy or fried foods are harder to digest and may make you feel nauseous or bloated.  Likewise meals heavy in dairy and vegetables can cause extra gas to form and this can also increase the bloating.  Drink plenty of fluids but you should avoid alcoholic beverages for 24 hours.  ACTIVITY: Your care partner should take you home directly after the procedure.  You should plan to take it easy, moving slowly for the rest of the day.  You can resume normal activity the day after the procedure however you should NOT DRIVE or use heavy machinery for 24 hours (because of the sedation medicines used during the test).    SYMPTOMS TO REPORT IMMEDIATELY: A gastroenterologist can be reached at any hour.  During normal business hours, 8:30 AM to 5:00 PM Monday through Friday,  call [phone].  After hours and on weekends, please call the GI answering service at [phone] who will take a message and have the physician on call contact you.   Following lower endoscopy (colonoscopy or flexible sigmoidoscopy):  Excessive amounts of blood in the stool  Significant tenderness or worsening of abdominal pains  Swelling of the abdomen that is new, acute  Fever of 100F or higher    FOLLOW UP: If any biopsies were taken you will be contacted by phone or by letter within the next 1-3 weeks.  Call your gastroenterologist if you have not heard about the biopsies in 3 weeks.  Our staff will call the home number listed on your records the next business day following your procedure to check on you and address any questions or concerns that you may have at that time regarding the information given to you following your procedure. This is a courtesy call and so if there is no answer at the home number and we have not heard from you through the emergency physician on call, we will assume that you have returned to your regular daily activities without incident.  SIGNATURES/CONFIDENTIALITY: You and/or your care partner have signed paperwork which will be entered into your electronic medical record.  These signatures attest to the fact that that the information above on your After Visit Summary has been reviewed and is understood.  Full responsibility of the confidentiality   of this discharge information lies with you and/or your care-partner.     

## 2014-01-09 NOTE — Progress Notes (Signed)
Procedure ends, to recovery, report given and VSS. 

## 2014-01-09 NOTE — Progress Notes (Signed)
Quick Note:  lmomtcb for pt ______ 

## 2014-01-12 ENCOUNTER — Encounter (INDEPENDENT_AMBULATORY_CARE_PROVIDER_SITE_OTHER): Admitting: Physical Therapy

## 2014-01-12 ENCOUNTER — Telehealth: Payer: Self-pay | Admitting: *Deleted

## 2014-01-12 DIAGNOSIS — M25559 Pain in unspecified hip: Secondary | ICD-10-CM

## 2014-01-12 NOTE — Telephone Encounter (Signed)
No answer, mailbox full  Unable to leave message

## 2014-01-13 ENCOUNTER — Encounter: Admitting: Physical Therapy

## 2014-01-26 ENCOUNTER — Telehealth: Payer: Self-pay | Admitting: Critical Care Medicine

## 2014-01-26 NOTE — Telephone Encounter (Signed)
LMTCB

## 2014-01-26 NOTE — Telephone Encounter (Signed)
Also, is wanting to speak to nurse about xolair.  [phone]

## 2014-01-27 NOTE — Telephone Encounter (Signed)
Spoke with [facility].  Virgel BouquetBreo is not covered without prior auth.  Advair diskus and Advair HFA are both covered in place of Dulera.  Please advise if we should proeed with PA of change to Advair.

## 2014-01-27 NOTE — Telephone Encounter (Signed)
Called spoke with pt. She reports she has already been on advair diskus in the past (couple years ago) and reports it messed with her "vocal cords". She wants to know if the advair HFA will do the same? Also reports the [employer] is needing a PA and will need a medical necessity letter from Dr. Delford FieldWright. Please advise Thanks

## 2014-01-27 NOTE — Telephone Encounter (Signed)
Ok to change to advair 220 hfa two puff bie

## 2014-01-27 NOTE — Progress Notes (Signed)
Quick Note:  lmomtcb for pt - I still have not received these forms via fax. Did pt fax them back yet? Would she prefer to come by office to sign ([facility])? ______

## 2014-01-28 NOTE — Telephone Encounter (Signed)
hfa advair should not cause throat irritation like the disk Ok to proceed with [employer] PA

## 2014-01-28 NOTE — Telephone Encounter (Signed)
LMTCB x 1 for pt regarding Advair HFA Will send to Katie per her request to follow up on starting Xolair PA

## 2014-01-29 NOTE — Telephone Encounter (Signed)
[facility] Asked if there would be side effects? Please call - [phone].

## 2014-01-29 NOTE — Telephone Encounter (Signed)
i agree with the comments made

## 2014-01-29 NOTE — Progress Notes (Signed)
Quick Note:  See phone msg from 01/26/14 for additional information regarding xolair start. ______

## 2014-01-29 NOTE — Telephone Encounter (Signed)
Per Florentina Addisonkatie send to her box

## 2014-01-29 NOTE — Telephone Encounter (Signed)
LMTCB-side effects could be increased heart rate, jitters, or thrush.

## 2014-01-29 NOTE — Telephone Encounter (Signed)
Spoke with patient-she feels that she really needs to be on Breo inhaler and would rather have this PA completed. I explained to patient if PW is willing to sign PA then I will do this for her. Pt is aware that I have left a sample at the front desk as well as the PAN form for Xolair to sign. Pt is aware that I will send message to PW to advise on PA to get this worked out for her.    Please send message back directly to [facility]. Thanks.

## 2014-02-04 NOTE — Telephone Encounter (Signed)
Pt calling to check on status of prior auth.Krystal GriffinsStanley A Dalton

## 2014-02-04 NOTE — Telephone Encounter (Signed)
Pt was told by Duffy RhodyStanley that I am working on her Xolair application and will follow through with PA once application has been submitted and reviewed by [employer].

## 2014-02-12 NOTE — Telephone Encounter (Signed)
Spoke with Krystal Scott in Medical Records-looks as though patient was placed on Xolair July 2005 and will require we order patients paper chart. Krystal Scott has ordered her chart and will send to me asap.

## 2014-02-23 ENCOUNTER — Telehealth: Payer: Self-pay | Admitting: Critical Care Medicine

## 2014-02-23 NOTE — Telephone Encounter (Signed)
Called [employer], spoke with [place].  Breo 200 mcg APPROVED from 02/23/2014 - lifetime. Case ID # is [id number]  lmomtcb for pt to inform her of above.  Does she need Breo rx sent to pharm?  Will also route msg to [place] to f/u on Xolair PA.

## 2014-02-23 NOTE — Telephone Encounter (Addendum)
Pt calling to check on status of approval for breo please advise she can be reached @ [phone] see would also like to know if she can get samples of breo until this matter is taken care off.Caren Griffins

## 2014-02-23 NOTE — Telephone Encounter (Signed)
[email or web address].Jennifer Castillo, CMA  

## 2014-02-24 NOTE — Telephone Encounter (Signed)
lmomtcb for pt regarding Breo   Will send msg to [place] to f/u on Xolair

## 2014-02-24 NOTE — Telephone Encounter (Signed)
lmtcb x1 

## 2014-02-24 NOTE — Telephone Encounter (Signed)
[phone] or [phone] calling back about getting a sample of one of her meds and about xoliar and about a rx for [facility] [address] and the pt is needing a brio sample

## 2014-02-24 NOTE — Telephone Encounter (Signed)
lmomtcb for pt 

## 2014-02-25 MED ORDER — AMOXICILLIN-POT CLAVULANATE 875-125 MG PO TABS: 1.0000 | ORAL_TABLET | Freq: Two times a day (BID) | ORAL | Status: DC

## 2014-02-25 MED ORDER — FLUTICASONE FUROATE-VILANTEROL 200-25 MCG/INH IN AEPB: 1.0000 | INHALATION_SPRAY | Freq: Every day | RESPIRATORY_TRACT | Status: AC

## 2014-02-25 MED ORDER — FLUTICASONE FUROATE-VILANTEROL 100-25 MCG/INH IN AEPB: 1.0000 | INHALATION_SPRAY | Freq: Every day | RESPIRATORY_TRACT | Status: DC

## 2014-02-25 NOTE — Telephone Encounter (Signed)
Left detailed message on pt machine per pt request. Rx called into pharmacy. Nothing further needed.

## 2014-02-25 NOTE — Telephone Encounter (Signed)
Called and spoke to pt. Pt stated she has a sinus infection and is requesting abx. Pt c/o PND, blowing out yellow mucus and hoarseness x 5 days. Pt denies f/c/s, headache, CP/tightness and SOB.   Dr. Delford FieldWright please advise.   Allergies  Allergen Reactions  . Avelox [Moxifloxacin Hcl In Nacl]     Couldn't eat for a week d/t sores on tongue. Sores very painful      --Pt inquired if Virgel BouquetBreo has been approved indefinitely, per phone note on 01/26/14. [facility] and left sample up front for pick up. Pt aware.

## 2014-02-25 NOTE — Telephone Encounter (Signed)
Pt called back. [facility] and a sample has been left up front for pick. Pt aware.   Pt questioned status of Xolair. Pt aware we will contact her back with an update.

## 2014-02-25 NOTE — Telephone Encounter (Signed)
Pt came by the office for another concern and was updated personally on the status of her Xolair application.

## 2014-02-25 NOTE — Telephone Encounter (Signed)
[facility] to [facility] since PW is unavailable this afternoon.

## 2014-02-25 NOTE — Telephone Encounter (Signed)
Ok to send in augmentin 875mg  one bid for 7 days.  Take on full stomach with large glass of water.

## 2014-02-25 NOTE — Telephone Encounter (Signed)
Returning call back, seeing response on previous calls about medications, requesting that nurse leaves detailed message on voicemail if she is unreachable.

## 2014-03-17 ENCOUNTER — Encounter: Payer: Self-pay | Admitting: Critical Care Medicine

## 2014-04-01 ENCOUNTER — Telehealth: Payer: Self-pay | Admitting: Critical Care Medicine

## 2014-04-01 NOTE — Telephone Encounter (Signed)
Spoke with [place] Scientist, research (physical sciences)(pharmacist) at [facility], states they needed to verify that PW was the prescribing provider for [employer]'s xolair.  I verified this.  Nothing further needed.

## 2014-04-02 ENCOUNTER — Ambulatory Visit: Admitting: Critical Care Medicine

## 2014-04-10 ENCOUNTER — Telehealth: Payer: Self-pay | Admitting: Critical Care Medicine

## 2014-04-10 NOTE — Telephone Encounter (Signed)
Pt states she is aware to contact [facility] to give consent for shipment of Xolair but would like to have PW advise if she should proceed with Xolair as she has felt GREAT with Breo inhaler (not having to use her rescue inhaler) and was able to complete half marathon without using her rescue inhaler. Pt is aware that PW is back in the office on Monday 04-13-14 and will send message for him to advise then.    Will forward to PW and [employer]Crystal Jones.

## 2014-04-11 NOTE — Telephone Encounter (Signed)
If she is doing well, lets HOLD OFF on xolair for now

## 2014-04-13 NOTE — Telephone Encounter (Signed)
lmtcb X1 for pt  

## 2014-04-14 NOTE — Telephone Encounter (Signed)
Spoke with pt - Discussed below per Dr. Delford FieldWright.  Pt is in agreement to hold off on Xolair for now.  She is to call office if symptoms worsen.  Pt in agreement with plan, confirmed pending appt for next week with Dr. Delford FieldWright, and voiced no further questions or concerns at this time.  Will sign off and route to Florentina AddisonKatie as FYI from [employer].

## 2014-04-15 DIAGNOSIS — K219 Gastro-esophageal reflux disease without esophagitis: Secondary | ICD-10-CM | POA: Insufficient documentation

## 2014-04-15 DIAGNOSIS — Z85850 Personal history of malignant neoplasm of thyroid: Secondary | ICD-10-CM | POA: Insufficient documentation

## 2014-04-15 DIAGNOSIS — N393 Stress incontinence (female) (male): Secondary | ICD-10-CM | POA: Insufficient documentation

## 2014-04-20 ENCOUNTER — Other Ambulatory Visit: Payer: Self-pay | Admitting: *Deleted

## 2014-04-20 MED ORDER — ESOMEPRAZOLE MAGNESIUM 40 MG PO CPDR: 40.0000 mg | DELAYED_RELEASE_CAPSULE | Freq: Every day | ORAL | Status: DC

## 2014-04-23 ENCOUNTER — Ambulatory Visit: Admitting: Critical Care Medicine

## 2014-05-07 ENCOUNTER — Ambulatory Visit: Admitting: Critical Care Medicine

## 2014-05-14 ENCOUNTER — Ambulatory Visit: Admitting: Critical Care Medicine

## 2014-05-18 ENCOUNTER — Ambulatory Visit (INDEPENDENT_AMBULATORY_CARE_PROVIDER_SITE_OTHER): Admitting: Family Medicine

## 2014-05-18 ENCOUNTER — Ambulatory Visit: Admitting: Family Medicine

## 2014-05-18 ENCOUNTER — Encounter: Payer: Self-pay | Admitting: Family Medicine

## 2014-05-18 VITALS — BP 121/79 | HR 76 | Ht 64.0 in | Wt 185.0 lb

## 2014-05-18 DIAGNOSIS — M542 Cervicalgia: Secondary | ICD-10-CM

## 2014-05-18 MED ORDER — DICLOFENAC POTASSIUM 50 MG PO TABS: 50.0000 mg | ORAL_TABLET | Freq: Two times a day (BID) | ORAL | Status: DC

## 2014-05-18 NOTE — Patient Instructions (Signed)
Call [facility] for an evaluation with Riki RuskJeremy or Clayburn Pertvan [phone] Take diclofenac 50mg  twice a day as needed with food for pain and inflammation. Consider prednisone, muscle relaxant, pain medication. Simple range of motion exercises within limits of pain to prevent further stiffness. Consider physical therapy for stretching, exercises, traction, and modalities. Heat 15 minutes at a time 3-4 times a day to help with spasms. Watch head position when on computers, texting, when sleeping in bed - should in line with back to prevent further spasms and strain. Follow up with me in 5-6 weeks.

## 2014-05-20 NOTE — Progress Notes (Signed)
PCP: SINTHUSEK,CHIRAPA  Subjective:   HPI: Patient is a 49 y.o. female here for neck pain.  Patient reports for the past 2 years or so she's had pain in neck and upper back more on the right side. Recalls carrying a backpack full of water bottles prior to this starting. No injury however. Pain radiated anteriorly in right shoulder. She's had trigger point injection in the past. Pain worse in the evenings. Associated with burning especially on Friday. No bowel/bladder dysfunction.  Past Medical History  Diagnosis Date  . Allergic rhinitis   . Asthma   . GERD (gastroesophageal reflux disease)   . Hypothyroidism   . Hypertension     Current Outpatient Prescriptions on File Prior to Visit  Medication Sig Dispense Refill  . albuterol (PROAIR HFA) 108 (90 BASE) MCG/ACT inhaler Inhale 2 puffs into the lungs every 4 (four) hours as needed. 3 Inhaler 2  . albuterol (PROVENTIL) (2.5 MG/3ML) 0.083% nebulizer solution Take 3 mLs (2.5 mg total) by nebulization every 6 (six) hours as needed. 1080 mL 1  . amoxicillin-clavulanate (AUGMENTIN) 875-125 MG per tablet Take 1 tablet by mouth 2 (two) times daily. 14 tablet 0  . Cholecalciferol (VITAMIN D) 2000 UNITS CAPS Take 1 capsule by mouth daily.    Marland Kitchen. esomeprazole (NEXIUM) 40 MG capsule Take 1 capsule (40 mg total) by mouth daily. 90 capsule 2  . fluticasone (FLONASE) 50 MCG/ACT nasal spray Place 2 sprays into both nostrils daily. 16 g 6  . Fluticasone Furoate-Vilanterol (BREO ELLIPTA) 200-25 MCG/INH AEPB Inhale 1 puff into the lungs daily. 3 each 3  . Fluticasone Furoate-Vilanterol 100-25 MCG/INH AEPB Inhale 1 puff into the lungs daily. 14 each 0  . levothyroxine (SYNTHROID, LEVOTHROID) 175 MCG tablet Take 175 mcg by mouth daily.     . mometasone-formoterol (DULERA) 200-5 MCG/ACT AERO Use until get Breo then stop Dulera and start Breo    . valsartan-hydrochlorothiazide (DIOVAN-HCT) 160-12.5 MG per tablet Take 1 tablet by mouth daily.     No  current facility-administered medications on file prior to visit.    Past Surgical History  Procedure Laterality Date  . Thyroidectomy      Allergies  Allergen Reactions  . Avelox [Moxifloxacin Hcl In Nacl]     Couldn't eat for a week d/t sores on tongue. Sores very painful    History   Social History  . Marital Status: Married    Spouse Name: N/A  . Number of Children: N/A  . Years of Education: N/A   Occupational History  . Not on file.   Social History Main Topics  . Smoking status: Never Smoker   . Smokeless tobacco: Never Used  . Alcohol Use: No  . Drug Use: No  . Sexual Activity: Not on file   Other Topics Concern  . Not on file   Social History Narrative    Family History  Problem Relation Age of Onset  . Hypertension Mother   . Diabetes Father   . Colon cancer Neg Hx   . Esophageal cancer Neg Hx   . Rectal cancer Neg Hx   . Stomach cancer Neg Hx     BP 121/79 mmHg  Pulse 76  Ht 5\' 4"  (1.626 m)  Wt 185 lb (83.915 kg)  BMI 31.74 kg/m2  Review of Systems: See HPI above.    Objective:  Physical Exam:  Gen: NAD  Neck: No gross deformity, swelling, bruising. TTP R > L paraspinal cervical regions, trapezius.  No midline/bony TTP.  FROM neck - pain with right lateral rotation. BUE strength 5/5.   Sensation intact to light touch.   2+ equal reflexes in triceps, biceps, brachioradialis tendons. Negative spurlings. NV intact distal BUEs.    Assessment & Plan:  1. Neck pain - most consistent with trapezius, cervical spasms/strain.  Start with diclofenac and she prefers to try chiropractic care instead of PT at this time.  Home exercises, heat, ergonomic issues discussed.  F/u in 5-6 weeks for reevaluation.

## 2014-05-21 DIAGNOSIS — M542 Cervicalgia: Secondary | ICD-10-CM | POA: Insufficient documentation

## 2014-05-21 NOTE — Assessment & Plan Note (Signed)
most consistent with trapezius, cervical spasms/strain.  Start with diclofenac and she prefers to try chiropractic care instead of PT at this time.  Home exercises, heat, ergonomic issues discussed.  F/u in 5-6 weeks for reevaluation.

## 2014-06-18 ENCOUNTER — Encounter: Payer: Self-pay | Admitting: Critical Care Medicine

## 2014-06-18 ENCOUNTER — Ambulatory Visit (INDEPENDENT_AMBULATORY_CARE_PROVIDER_SITE_OTHER): Admitting: Critical Care Medicine

## 2014-06-18 VITALS — BP 116/80 | HR 84 | Temp 97.8°F | Ht 63.0 in | Wt 187.0 lb

## 2014-06-18 DIAGNOSIS — J45901 Unspecified asthma with (acute) exacerbation: Secondary | ICD-10-CM

## 2014-06-18 DIAGNOSIS — J4550 Severe persistent asthma, uncomplicated: Secondary | ICD-10-CM | POA: Diagnosis not present

## 2014-06-18 MED ORDER — FLUTICASONE PROPIONATE 50 MCG/ACT NA SUSP: 2.0000 | Freq: Every day | NASAL | Status: DC

## 2014-06-18 NOTE — Assessment & Plan Note (Signed)
Marked serial improvement in pulmonary function testing now on Breo with excellent response Plan Maintain inhaled medications as prescribed Increase Flonase to twice daily for 2 weeks then reduce to once daily thereafter

## 2014-06-18 NOTE — Progress Notes (Signed)
Subjective:    Patient ID: Krystal Scott, female    DOB: 1966-02-18, 49 y.o.   MRN: [id number]  HPI  06/18/2014 Chief Complaint  Patient presents with  . Follow-up    sneezing, allergies; ran 1/2 marathon in January 2016.  has not had to use inhaler since November 2015.    Ran a 1/2 marathon.  Hx of non adherent.  No inhaler use since 12/2013.  Virgel Bouquet has helped and running and doing well.  No using saba since 12/2013.  Was overusing Saba. Pollen issues with pndrip.  No real wheeze.  Pt denies any significant sore throat, nasal congestion or excess secretions, fever, chills, sweats, unintended weight loss, pleurtic or exertional chest pain, orthopnea PND, or leg swelling Pt denies any increase in rescue therapy over baseline, denies waking up needing it or having any early am or nocturnal exacerbations of coughing/wheezing/or dyspnea. Pt also denies any obvious fluctuation in symptoms with  weather or environmental change or other alleviating or aggravating factors     PUL ASTHMA HISTORY 06/18/2014 12/04/2013 10/17/2012 05/27/2012 03/28/2012  Symptoms 0-2 days/week 0-2 days/week >2 days/week 0-2 days/week Daily  Nighttime awakenings 0-2/month 0-2/month 0-2/month - 0-2/month  Interference with activity No limitations No limitations No limitations No limitations Minor limitations  SABA use 0-2 days/wk > 2 days/wk--not > 1 x/day > 2 days/wk--not > 1 x/day 0-2 days/wk Daily  Exacerbations requiring oral steroids 0-1 / year - 0-1 / year 0-1 / year 2 or more / year     Review of Systems Constitutional:   No  weight loss, night sweats,  Fevers, chills, fatigue, lassitude. HEENT:   No headaches,  Difficulty swallowing,  Tooth/dental problems,  Sore throat,                No sneezing, itching, ear ache, +nasal congestion, no post nasal drip,   CV:  No chest pain,  Orthopnea, PND, swelling in lower extremities, anasarca, dizziness, palpitations  GI  No heartburn, indigestion, abdominal pain,  nausea, vomiting, diarrhea, change in bowel habits, loss of appetite  Resp: Notes  shortness of breath with exertion not  at rest.  No excess mucus, no productive cough,  Notes  non-productive cough,  No coughing up of blood.  No change in color of mucus.  No wheezing.  No chest wall deformity  Skin: no rash or lesions.  GU: no dysuria, change in color of urine, no urgency or frequency.  No flank pain.  MS:  No joint pain or swelling.  No decreased range of motion.  No back pain.  Psych:  No change in mood or affect. No depression or anxiety.  No memory loss.     Objective:   Physical Exam  Filed Vitals:   06/18/14 1209  BP: 116/80  Pulse: 84  Temp: 97.8 F (36.6 C)  TempSrc: Oral  Height:  (1.6 m)  Weight: 187 lb (84.823 kg)  SpO2: 97%    Gen: Pleasant, well-nourished, in no distress,  normal affect  ENT: No lesions,  mouth clear,  oropharynx clear, no postnasal drip, moderate nasal inflammation  Neck: No JVD, no TMG, no carotid bruits  Lungs: No use of accessory muscles, no dullness to percussion,no  Wheeze  Cardiovascular: RRR, heart sounds normal, no murmur or gallops, no peripheral edema  Abdomen: soft and NT, no HSM,  BS normal  Musculoskeletal: No deformities, no cyanosis or clubbing  Neuro: alert, non focal  Skin: Warm, no lesions or rashes  2008  spiro: FeV1 54%  PFTs 04/29/2012: FeV1 76% FVC 102% FeV1/FVC 57% TLC 113% DLCO 99% PFTs 06/18/2014:  FeV1 82%  FeV1/FVC 71%  No results found.       Assessment & Plan:   Asthma, severe persistent Marked serial improvement in pulmonary function testing now on Breo with excellent response Plan Maintain inhaled medications as prescribed Increase Flonase to twice daily for 2 weeks then reduce to once daily thereafter      Updated Medication List Outpatient Encounter Prescriptions as of 06/18/2014  Medication Sig  . albuterol (PROAIR HFA) 108 (90 BASE) MCG/ACT inhaler Inhale 2 puffs into the lungs  every 4 (four) hours as needed.  Marland Kitchen. albuterol (PROVENTIL) (2.5 MG/3ML) 0.083% nebulizer solution Take 3 mLs (2.5 mg total) by nebulization every 6 (six) hours as needed.  . Cholecalciferol (VITAMIN D) 2000 UNITS CAPS Take 1 capsule by mouth daily.  Marland Kitchen. esomeprazole (NEXIUM) 40 MG capsule Take 1 capsule (40 mg total) by mouth daily.  . fluticasone (FLONASE) 50 MCG/ACT nasal spray Place 2 sprays into both nostrils daily.  . Fluticasone Furoate-Vilanterol (BREO ELLIPTA) 200-25 MCG/INH AEPB Inhale 1 puff into the lungs daily.  Marland Kitchen. levonorgestrel (MIRENA) 20 MCG/24HR IUD 1 each by Intrauterine route.  Marland Kitchen. levothyroxine (SYNTHROID, LEVOTHROID) 175 MCG tablet Take 175 mcg by mouth daily.   . valsartan-hydrochlorothiazide (DIOVAN-HCT) 160-12.5 MG per tablet Take 1 tablet by mouth daily.  . [DISCONTINUED] fluticasone (FLONASE) 50 MCG/ACT nasal spray Place 2 sprays into both nostrils daily.  . [DISCONTINUED] amoxicillin-clavulanate (AUGMENTIN) 875-125 MG per tablet Take 1 tablet by mouth 2 (two) times daily. (Patient not taking: Reported on 06/18/2014)  . [DISCONTINUED] diclofenac (CATAFLAM) 50 MG tablet Take 1 tablet (50 mg total) by mouth 2 (two) times daily. (Patient not taking: Reported on 06/18/2014)  . [DISCONTINUED] Fluticasone Furoate-Vilanterol 100-25 MCG/INH AEPB Inhale 1 puff into the lungs daily. (Patient not taking: Reported on 06/18/2014)  . [DISCONTINUED] mometasone-formoterol (DULERA) 200-5 MCG/ACT AERO Use until get Breo then stop Dulera and start Breo (Patient not taking: Reported on 06/18/2014)

## 2014-06-18 NOTE — Patient Instructions (Signed)
Stay on breo daily Increase flonase two puff twice daily ea nare for 2 weeks then daily thereafter [facility]

## 2014-06-22 ENCOUNTER — Ambulatory Visit: Admitting: Family Medicine

## 2014-11-26 ENCOUNTER — Ambulatory Visit (INDEPENDENT_AMBULATORY_CARE_PROVIDER_SITE_OTHER): Admitting: Pulmonary Disease

## 2014-11-26 DIAGNOSIS — J45998 Other asthma: Secondary | ICD-10-CM

## 2014-11-26 NOTE — Progress Notes (Signed)
   Subjective:    Patient ID: Krystal Scott, female    DOB: 1965/06/30, 49 y.o.   MRN: [id number]  HPI 48/M severe persistent asthma , on breo   2008 spiro: FeV1 54%  PFTs 04/29/2012: FeV1 76% FVC 102% FeV1/FVC 57% TLC 113% DLCO 99% PFTs 06/18/2014: FeV1 82% FeV1/FVC 71%   Review of Systems     Objective:   Physical Exam        Assessment & Plan:

## 2015-03-11 ENCOUNTER — Other Ambulatory Visit: Payer: Self-pay | Admitting: Critical Care Medicine

## 2015-05-04 ENCOUNTER — Other Ambulatory Visit: Payer: Self-pay | Admitting: Critical Care Medicine

## 2015-05-18 ENCOUNTER — Other Ambulatory Visit: Payer: Self-pay | Admitting: Critical Care Medicine
